# Patient Record
Sex: Female | Born: 1955 | Race: White | Hispanic: No | Marital: Married | Smoking: Former smoker
Health system: Southern US, Community
[De-identification: ages and names within clinical notes are randomized; demographics above are authoritative.]

## PROBLEM LIST (undated history)

## (undated) DIAGNOSIS — T4145XA Adverse effect of unspecified anesthetic, initial encounter: Secondary | ICD-10-CM

## (undated) DIAGNOSIS — E119 Type 2 diabetes mellitus without complications: Secondary | ICD-10-CM

## (undated) DIAGNOSIS — I10 Essential (primary) hypertension: Secondary | ICD-10-CM

## (undated) DIAGNOSIS — D649 Anemia, unspecified: Secondary | ICD-10-CM

## (undated) DIAGNOSIS — F319 Bipolar disorder, unspecified: Secondary | ICD-10-CM

## (undated) DIAGNOSIS — R160 Hepatomegaly, not elsewhere classified: Secondary | ICD-10-CM

## (undated) DIAGNOSIS — M545 Low back pain, unspecified: Secondary | ICD-10-CM

## (undated) DIAGNOSIS — K7460 Unspecified cirrhosis of liver: Secondary | ICD-10-CM

## (undated) DIAGNOSIS — E669 Obesity, unspecified: Secondary | ICD-10-CM

## (undated) DIAGNOSIS — G8929 Other chronic pain: Secondary | ICD-10-CM

## (undated) DIAGNOSIS — E8881 Metabolic syndrome: Secondary | ICD-10-CM

## (undated) DIAGNOSIS — R112 Nausea with vomiting, unspecified: Secondary | ICD-10-CM

## (undated) DIAGNOSIS — T8859XA Other complications of anesthesia, initial encounter: Secondary | ICD-10-CM

## (undated) DIAGNOSIS — R161 Splenomegaly, not elsewhere classified: Secondary | ICD-10-CM

## (undated) DIAGNOSIS — E1142 Type 2 diabetes mellitus with diabetic polyneuropathy: Secondary | ICD-10-CM

## (undated) DIAGNOSIS — K219 Gastro-esophageal reflux disease without esophagitis: Secondary | ICD-10-CM

## (undated) DIAGNOSIS — E785 Hyperlipidemia, unspecified: Secondary | ICD-10-CM

## (undated) DIAGNOSIS — Z9889 Other specified postprocedural states: Secondary | ICD-10-CM

## (undated) DIAGNOSIS — K7581 Nonalcoholic steatohepatitis (NASH): Secondary | ICD-10-CM

## (undated) DIAGNOSIS — G2581 Restless legs syndrome: Secondary | ICD-10-CM

## (undated) DIAGNOSIS — E559 Vitamin D deficiency, unspecified: Secondary | ICD-10-CM

## (undated) DIAGNOSIS — K589 Irritable bowel syndrome without diarrhea: Secondary | ICD-10-CM

## (undated) HISTORY — DX: Essential (primary) hypertension: I10

## (undated) HISTORY — PX: FINGER SURGERY: SHX640

## (undated) HISTORY — DX: Anemia, unspecified: D64.9

## (undated) HISTORY — DX: Hyperlipidemia, unspecified: E78.5

## (undated) HISTORY — DX: Low back pain: M54.5

## (undated) HISTORY — PX: GALLBLADDER SURGERY: SHX652

## (undated) HISTORY — DX: Other chronic pain: G89.29

## (undated) HISTORY — PX: OTHER SURGICAL HISTORY: SHX169

## (undated) HISTORY — DX: Metabolic syndrome: E88.810

## (undated) HISTORY — DX: Low back pain, unspecified: M54.50

## (undated) HISTORY — DX: Bipolar disorder, unspecified: F31.9

## (undated) HISTORY — DX: Hepatomegaly, not elsewhere classified: R16.0

## (undated) HISTORY — DX: Splenomegaly, not elsewhere classified: R16.1

## (undated) HISTORY — DX: Type 2 diabetes mellitus with diabetic polyneuropathy: E11.42

## (undated) HISTORY — DX: Gastro-esophageal reflux disease without esophagitis: K21.9

## (undated) HISTORY — PX: ABDOMINAL HYSTERECTOMY: SHX81

## (undated) HISTORY — DX: Type 2 diabetes mellitus without complications: E11.9

## (undated) HISTORY — PX: LUMBAR DISC SURGERY: SHX700

## (undated) HISTORY — DX: Metabolic syndrome: E88.81

## (undated) HISTORY — PX: OVARIAN CYST SURGERY: SHX726

## (undated) HISTORY — DX: Obesity, unspecified: E66.9

## (undated) HISTORY — DX: Restless legs syndrome: G25.81

## (undated) HISTORY — PX: LAMINECTOMY LUMBAR SPLINE W/ PLACEMENT SPINAL CORD STIMULATOR: SHX1916

## (undated) HISTORY — PX: CERVICAL SPINE SURGERY: SHX589

## (undated) HISTORY — DX: Vitamin D deficiency, unspecified: E55.9

---

## 2001-01-07 ENCOUNTER — Encounter: Admission: RE | Admit: 2001-01-07 | Discharge: 2001-01-07 | Payer: Self-pay | Admitting: Internal Medicine

## 2001-01-07 ENCOUNTER — Encounter: Payer: Self-pay | Admitting: Internal Medicine

## 2001-12-15 ENCOUNTER — Other Ambulatory Visit: Admission: RE | Admit: 2001-12-15 | Discharge: 2001-12-15 | Payer: Self-pay | Admitting: Internal Medicine

## 2003-07-10 ENCOUNTER — Emergency Department (HOSPITAL_COMMUNITY): Admission: EM | Admit: 2003-07-10 | Discharge: 2003-07-10 | Payer: Self-pay | Admitting: Emergency Medicine

## 2003-10-31 ENCOUNTER — Ambulatory Visit (HOSPITAL_COMMUNITY): Admission: RE | Admit: 2003-10-31 | Discharge: 2003-10-31 | Payer: Self-pay | Admitting: Internal Medicine

## 2004-02-14 ENCOUNTER — Emergency Department (HOSPITAL_COMMUNITY): Admission: EM | Admit: 2004-02-14 | Discharge: 2004-02-14 | Payer: Self-pay | Admitting: Emergency Medicine

## 2004-02-24 ENCOUNTER — Encounter: Admission: RE | Admit: 2004-02-24 | Discharge: 2004-02-24 | Payer: Self-pay | Admitting: Internal Medicine

## 2004-04-24 ENCOUNTER — Ambulatory Visit (HOSPITAL_COMMUNITY): Admission: RE | Admit: 2004-04-24 | Discharge: 2004-04-25 | Payer: Self-pay | Admitting: Neurosurgery

## 2004-05-27 ENCOUNTER — Encounter: Admission: RE | Admit: 2004-05-27 | Discharge: 2004-05-27 | Payer: Self-pay | Admitting: Neurosurgery

## 2004-09-02 ENCOUNTER — Encounter: Admission: RE | Admit: 2004-09-02 | Discharge: 2004-09-02 | Payer: Self-pay | Admitting: Neurosurgery

## 2004-09-15 ENCOUNTER — Encounter: Admission: RE | Admit: 2004-09-15 | Discharge: 2004-09-15 | Payer: Self-pay | Admitting: Neurosurgery

## 2004-09-25 ENCOUNTER — Emergency Department (HOSPITAL_COMMUNITY): Admission: EM | Admit: 2004-09-25 | Discharge: 2004-09-25 | Payer: Self-pay | Admitting: Emergency Medicine

## 2004-11-13 ENCOUNTER — Encounter: Admission: RE | Admit: 2004-11-13 | Discharge: 2004-11-13 | Payer: Self-pay | Admitting: Neurosurgery

## 2005-03-03 ENCOUNTER — Encounter: Admission: RE | Admit: 2005-03-03 | Discharge: 2005-03-03 | Payer: Self-pay | Admitting: Neurosurgery

## 2005-03-10 ENCOUNTER — Encounter: Admission: RE | Admit: 2005-03-10 | Discharge: 2005-03-10 | Payer: Self-pay | Admitting: Neurosurgery

## 2005-06-12 ENCOUNTER — Encounter: Admission: RE | Admit: 2005-06-12 | Discharge: 2005-06-12 | Payer: Self-pay | Admitting: Neurosurgery

## 2005-09-12 ENCOUNTER — Emergency Department (HOSPITAL_COMMUNITY): Admission: EM | Admit: 2005-09-12 | Discharge: 2005-09-12 | Payer: Self-pay | Admitting: Emergency Medicine

## 2005-09-25 IMAGING — RF DG MYELOGRAM LUMBAR
13 of 15 series · 13 of 15 positions shown · IV contrast (omnipaque)
Comparison: none

CLINICAL DATA: The patient has had multiple low back surgeries with fusion from L4 through S1.  She has continued left low back and left lower extremity pain. 
LUMBAR MYELOGRAM:
The low back was prepped and draped in a sterile fashion.  Lidocaine was utilized for local anesthesia.  Under fluoroscopic guidance, a 22 gauge spinal needle was inserted into the CSF space via a left paramedian L3-4 approach.  20 cc Omnipaque 180 was administered without complications.

[Series 1: (hospital) · 1 of 1 slices shown]
[im 1/1]
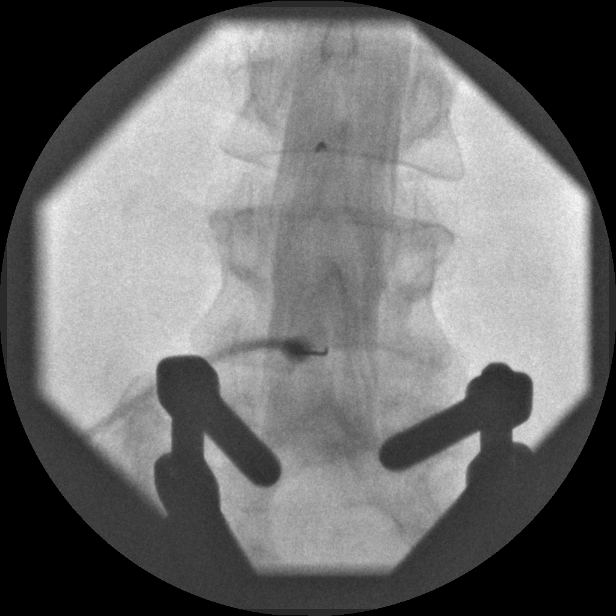

[Series 2: myelogram  white · 1 of 1 slices shown (1 of 12)]
[im 1/1]
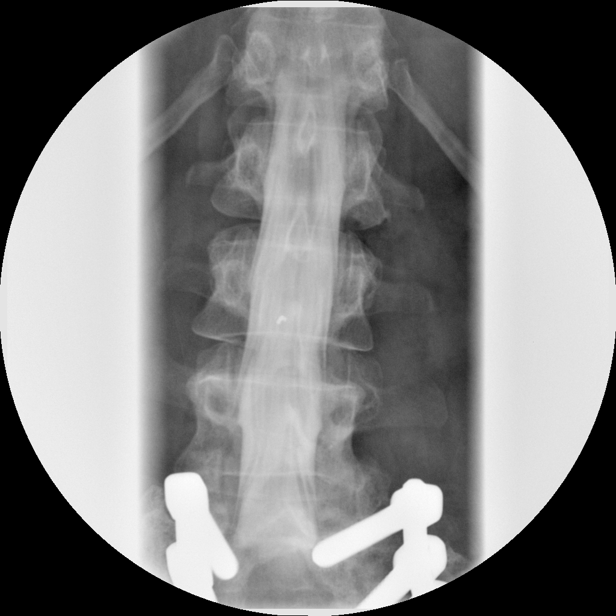

[Series 3: myelogram  white · 1 of 1 slices shown (2 of 12)]
[im 1/1]
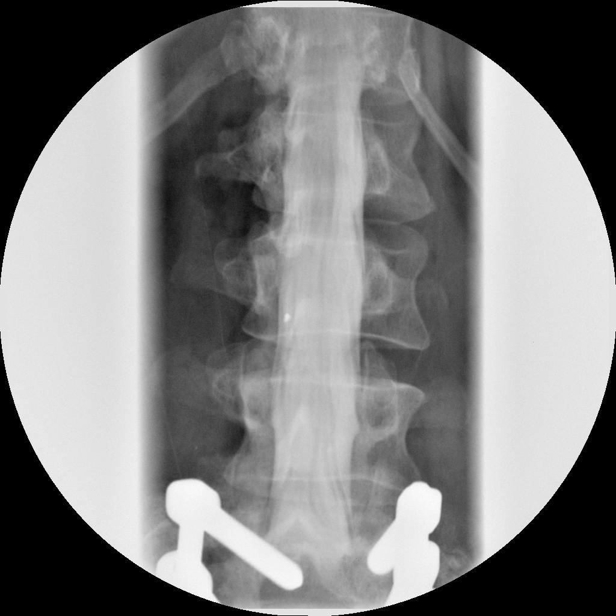

[Series 5: myelogram  white · 1 of 1 slices shown (3 of 12)]
[im 1/1]
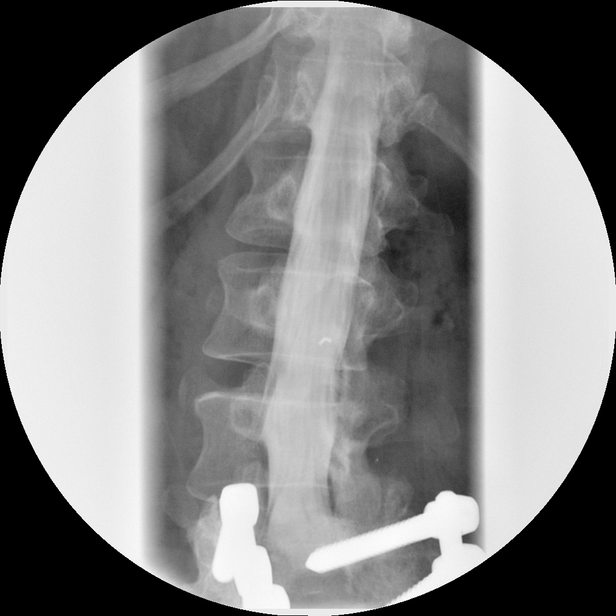

[Series 6: myelogram  white · 1 of 1 slices shown (4 of 12)]
[im 1/1]
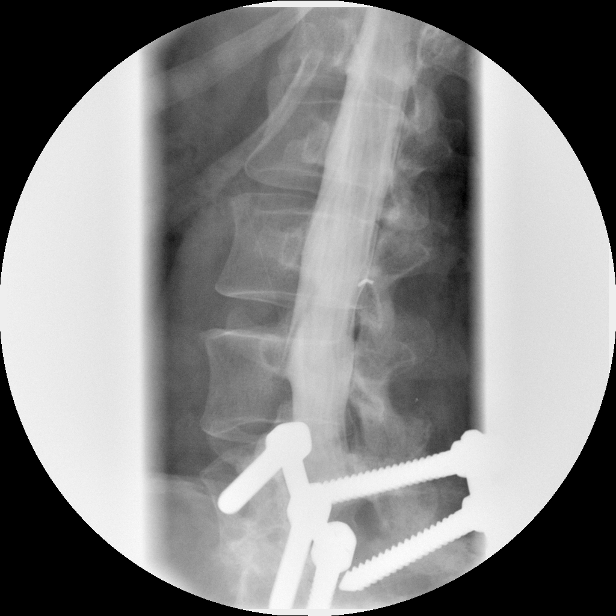

[Series 7: myelogram  white · 1 of 1 slices shown (5 of 12)]
[im 1/1]
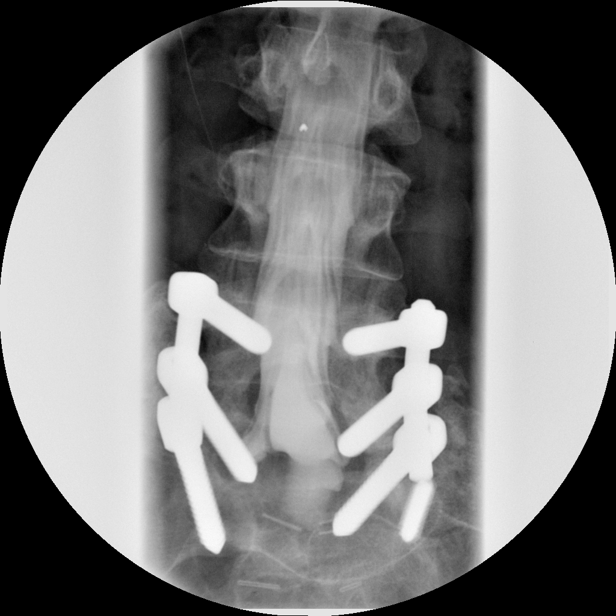

[Series 8: myelogram  white · 1 of 1 slices shown (6 of 12)]
[im 1/1]
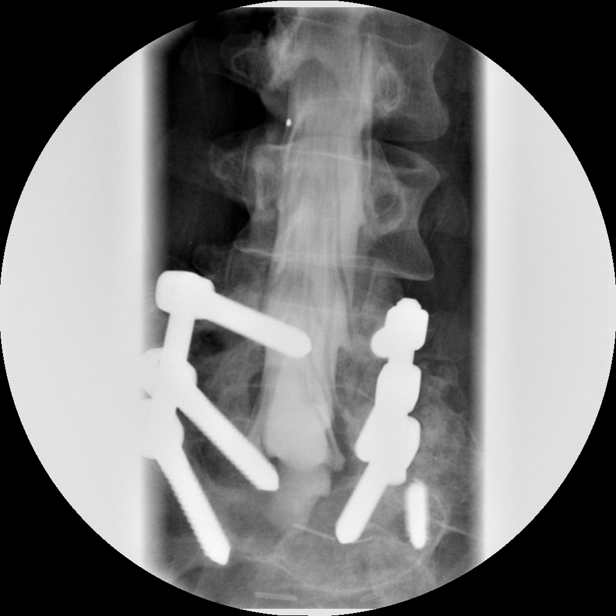

[Series 9: myelogram  white · 1 of 1 slices shown (7 of 12)]
[im 1/1]
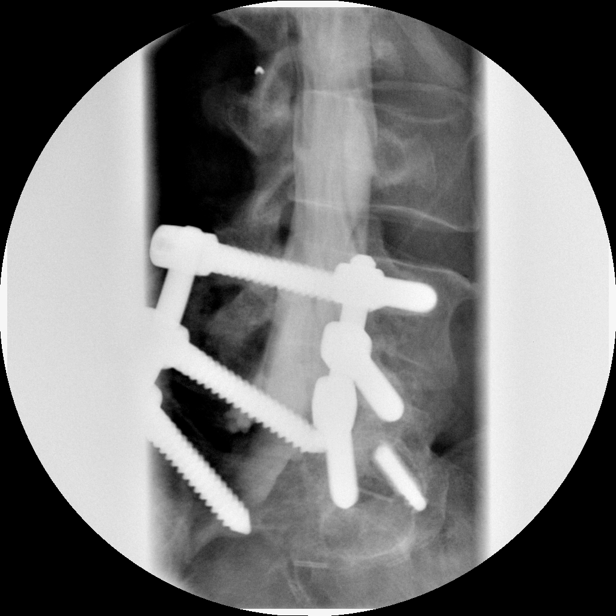

[Series 10: myelogram  white · 1 of 1 slices shown (8 of 12)]
[im 1/1]
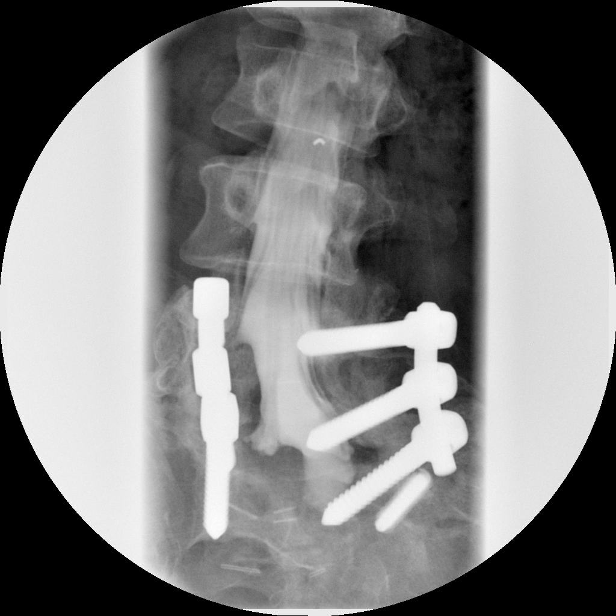

[Series 11: myelogram  white · 1 of 1 slices shown (9 of 12)]
[im 1/1]
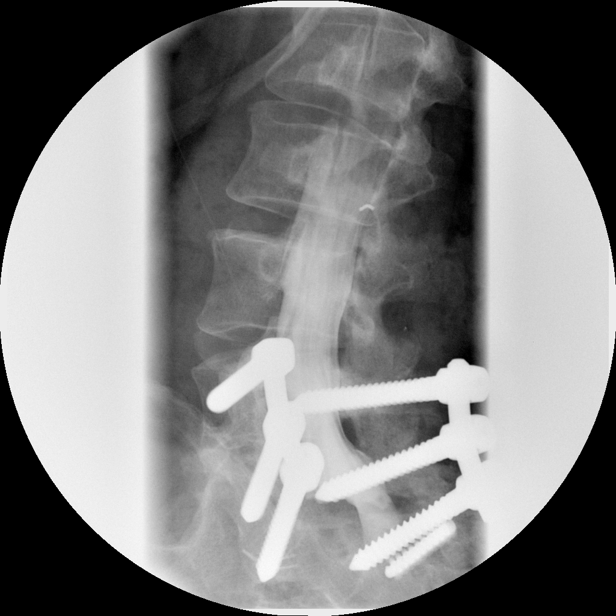

[Series 13: myelogram  white · 1 of 1 slices shown (10 of 12)]
[im 1/1]
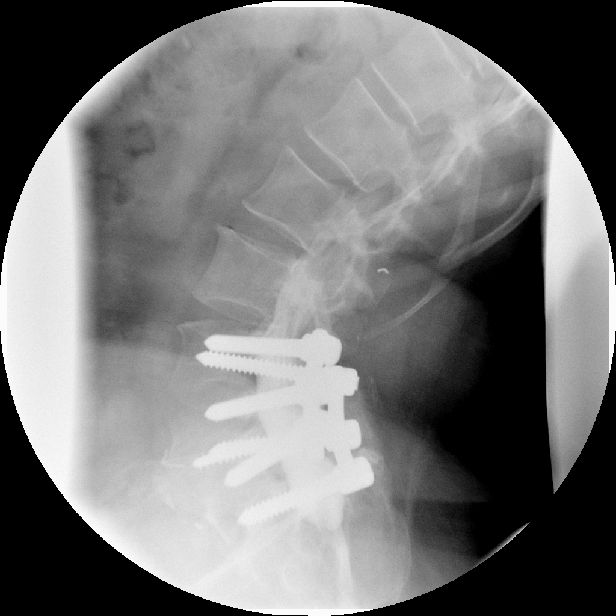

[Series 14: myelogram  white · 1 of 1 slices shown (11 of 12)]
[im 1/1]
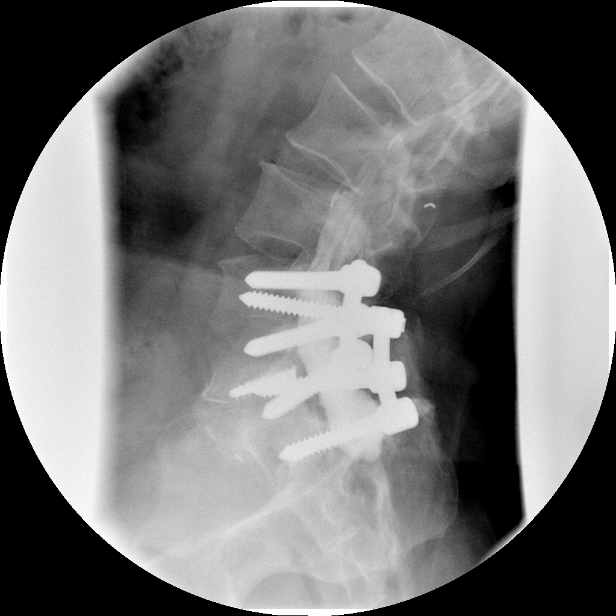

[Series 15: myelogram  white · 1 of 1 slices shown (12 of 12)]
[im 1/1]
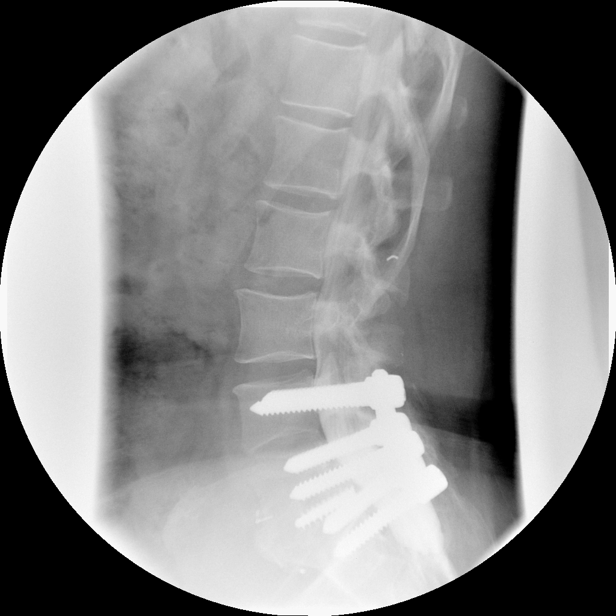

[13 of 15 positions shown; findings below may reference images not displayed]

FINDINGS: The images document intrathecal contrast.  Bilateral pedicle screws are seen in L4, L5 and S1.  An additional screw fragment is seen in the right S1 pedicle from previously broken hardware.  There is also a metallic fragment very small that is in the L1-2 interspinous ligament. 
There is a significant left paracentral extradural defect causing mass effect upon the thecal sac at the L5-S1 disk just left of center.    There is no evidence of abnormal motion on the flexion or extension views to suggest instability.  There is gross alignment of the vertebral bodies on the lateral view.  There is no vertebral body height loss.  Callus formation is seen at the L5-S1 disk.
IMPRESSION: Lumbar myelogram in preparation for CT.  Mass effect upon the left S1 nerve root is noted. 

LUMBAR MYELOGRAM CT:
FINDINGS: The images were reconstructed in multiplanar views.  2 to 3 mm of anterolisthesis of L5 upon S1 is seen.  There is no vertebral body height loss.  Bilateral pedicle screws are seen at L4, L5, and S1.  A second screw fragment is seen in the right S1 pedicle.  A very tiny metallic fragment is seen in the L1-2 interspinous ligament.  A punctate metallic density is seen in the right paraspinal soft tissues at L3.  There is significant streak artifact from the metallic hardware.  There is, however, solid fusion that is visualized across the posterior elements from L4 through S1.  Solid fusion through portions of the L5-S1 disk is present.  The L4-5 disk is not fused.  The right S1 pedicle screw that is intact does encroach upon the right S1 nerve root foramen.  The patient?s symptoms are left sided, however. 
L1-2, L2-3:  No disk herniation, central stenosis or foraminal stenosis.
L3-4:  A focal left foraminal disk protrusion is present without neural impingement.  Posterior decompression is noted.  Facet arthropathy is mild, right greater than left. 
L4-5:  Posterior decompression is noted without central canal stenosis.  The foramina are grossly patent.  The left L5 nerve root sleeve is difficult to visualize. 
L5-S1:  Posterior decompression and bilateral pedicle screws are noted.  The intact right pedicle screw does encroach upon the right S1 neural foramen.  This effaces the right S1 nerve root sleeve.  The left S1 nerve root sleeve is dilated and is effaced at the lateral recess.
IMPRESSION: 1.  Posterior fusion L4 through S1 is solid.  
2.  The left L5 nerve root sleeve is difficult to visualize due to streak artifact. 
3.  The right S1 pedicle screw encroaches upon the S1 foramen with effacement of the nerve root sleeve, however, the patient?s symptoms are left sided. 
4.  Left foraminal disk herniation at L3-4. 
5.   The left S1 nerve root sleeve is dilated and is then effaced in the lateral recess. I suspect this is due to scar/fibrosis.

## 2006-07-13 ENCOUNTER — Other Ambulatory Visit: Admission: RE | Admit: 2006-07-13 | Discharge: 2006-07-13 | Payer: Self-pay | Admitting: Internal Medicine

## 2008-04-13 ENCOUNTER — Encounter: Admission: RE | Admit: 2008-04-13 | Discharge: 2008-04-13 | Payer: Self-pay | Admitting: Orthopedic Surgery

## 2008-07-23 ENCOUNTER — Encounter: Admission: RE | Admit: 2008-07-23 | Discharge: 2008-07-23 | Payer: Self-pay | Admitting: Internal Medicine

## 2009-04-24 ENCOUNTER — Encounter: Admission: RE | Admit: 2009-04-24 | Discharge: 2009-04-24 | Payer: Self-pay | Admitting: Internal Medicine

## 2009-07-08 ENCOUNTER — Other Ambulatory Visit: Admission: RE | Admit: 2009-07-08 | Discharge: 2009-07-08 | Payer: Self-pay | Admitting: Internal Medicine

## 2009-09-05 ENCOUNTER — Emergency Department (HOSPITAL_COMMUNITY): Admission: EM | Admit: 2009-09-05 | Discharge: 2009-09-05 | Payer: Self-pay | Admitting: Emergency Medicine

## 2010-02-06 ENCOUNTER — Encounter: Admission: RE | Admit: 2010-02-06 | Discharge: 2010-02-06 | Payer: Self-pay | Admitting: Internal Medicine

## 2010-05-02 ENCOUNTER — Encounter: Admission: RE | Admit: 2010-05-02 | Discharge: 2010-05-02 | Payer: Self-pay | Admitting: Internal Medicine

## 2010-06-27 ENCOUNTER — Encounter: Admission: RE | Admit: 2010-06-27 | Discharge: 2010-06-27 | Payer: Self-pay | Admitting: Obstetrics and Gynecology

## 2010-08-17 ENCOUNTER — Encounter: Payer: Self-pay | Admitting: Internal Medicine

## 2010-08-17 ENCOUNTER — Encounter: Payer: Self-pay | Admitting: Neurosurgery

## 2010-08-18 ENCOUNTER — Encounter: Payer: Self-pay | Admitting: Orthopedic Surgery

## 2010-08-26 ENCOUNTER — Other Ambulatory Visit: Payer: Self-pay | Admitting: Orthopedic Surgery

## 2010-08-26 DIAGNOSIS — IMO0002 Reserved for concepts with insufficient information to code with codable children: Secondary | ICD-10-CM

## 2010-08-26 DIAGNOSIS — M47817 Spondylosis without myelopathy or radiculopathy, lumbosacral region: Secondary | ICD-10-CM

## 2010-08-28 ENCOUNTER — Ambulatory Visit
Admission: RE | Admit: 2010-08-28 | Discharge: 2010-08-28 | Disposition: A | Discharge status: Home / Self Care | Payer: MEDICARE | Source: Ambulatory Visit | Attending: Orthopedic Surgery | Admitting: Orthopedic Surgery

## 2010-08-28 DIAGNOSIS — IMO0002 Reserved for concepts with insufficient information to code with codable children: Secondary | ICD-10-CM

## 2010-08-28 DIAGNOSIS — M47817 Spondylosis without myelopathy or radiculopathy, lumbosacral region: Secondary | ICD-10-CM

## 2010-11-24 ENCOUNTER — Other Ambulatory Visit: Payer: Self-pay | Admitting: Obstetrics and Gynecology

## 2010-11-24 DIAGNOSIS — Z09 Encounter for follow-up examination after completed treatment for conditions other than malignant neoplasm: Secondary | ICD-10-CM

## 2010-12-12 NOTE — Op Note (Signed)
NAME:  LANAYA, BENNIS                 ACCOUNT NO.:  [id number]   MEDICAL RECORD NO.:  [id number]          PATIENT TYPE:  OIB   LOCATION:  5008                         FACILITY:  [facility]   PHYSICIAN:  Donalee Citrin, M.D.        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   POSTOPERATIVE DIAGNOSIS:  Cervical spondylitic myelopathy from ruptured  disk, cervical spinal stenosis C3-4 as well as C5 radiculopathy from  ruptured disk C4-5.   PROCEDURE:  Anterior cervical diskectomy with fusion C3-4 and C4-5 using  superomedial patellar wedges and the 37 __________ vision plate with four 12  mm __________ screws and two 13 mm screws.  These were then plated with four  12 mm __________ screws and two 13 mm screws.   SURGEON:  Donalee Citrin, M.D.   ASSISTANT:  __________   ANESTHESIA:  General endotracheal.   HISTORY OF PRESENT ILLNESS:  The patient is a very pleasant 55 year old  female who has had progressive neck pain as well as bilateral arm pain and  weakness.  Initially it began on the right side and has progressed to the  left with weakness in her hands and difficulty opening jars.  X-rays showed  a very large ruptured disk causing severe spinal compression, spinal  stenosis at C3-4 as well as a ruptured disk preforaminally, compressing the  right C5 nerve root.  Both of these were felt to be symptomatic, and due to  the patient's clinical exam and preoperative imaging, the patient was  recommended to undergo cervical diskectomy.  I did explain to him the risks  and benefits of surgery.  She understands and agrees to go forward.   DESCRIPTION OF PROCEDURE:  The patient was taken to the operating room.  She  was induced under general anesthesia.   Supine, neck in slight extension and  five pounds of Halter traction, the right side of her neck was prepped and  draped in the usual sterile fashion.  Preoperative imaging localized the C5  vertebral body.  A curvilinear incision was made just off the midline and to  the anterior border of the sternocleidomastoid, superficial layer of the  platysma and dissected out longitudinally.  The avascular plane between the  sternocleidomastoid and strap muscle was developed down to the prevertebral  fascia.  The prevertebral fascia was dissected using Kitners.  Intraoperative x-ray confirmed localization of the C4-5 disk space.  The  longus coli was then reflected laterally.  Annulotomy was extended with the  15 blade scalpel to mark the disk spaces.  A self-retaining retractor was  placed .  The both annulotomies were extended.  A high speed drill was used  to move in toward the annulus and using 2-3 mm Kerrison punch, anterior  osteophytic punch of C3 and C4 vertebral bodies were underbitten.  Then  first  at C3-4, a high speed drill was used to drill down to the posterior  annulus and posterior longitudinal ligament.  This was all removed in a  piecemeal fashion.  There was noted to be significant hypertrophy in the  ligaments and disk material as well as a large posterior aspect on the C3  vertebral body, compressing the thecal sac both centrally and left.  This  was all underbitten with a 1-2 mm Kerrison punch and both proximal C4 nerve  neural foramina were identified and decompressed.  At the end a diskectomy  revealed no further stenosis at this level.  Gelfoam was placed.  The  __________  but the procedure was repeated.  Posterior annulus and posterior  osteophytes were removed in a piecemeal fashion, decompressing the thecal  sac.  There was noted to be some uncinate hypertrophy and disk material  compressing the right C5.  This was all under bitten and removed.  Both C5  neural foramina  were explored and noted to be widely decompressed.  At the  end of the diskectomy there was no further stenosis.  Two 6 mm __________  were sized and fashioned to fit approximately 1 mm deep to the anterior  vertebrae with good apposition of the endplate.  A 37.5 mm endplate was  sized, selected and inserted.  Six screws were inserted.  All screws had  excellent purchase.  Two 13 mm screws in C3 and C4, 12 mm in C4 and C5.  __________  was copiously irrigated with meticulous hemostasis maintained.  The platysma was reapproximated with interrupted 3-0 interrupted Vicryl, and  the skin was closed with a running 4-0 subcuticular.  Benzoin and Steri-  Strips were applied.  The patient went to the recovery room in stable  condition.       GC/MEDQ  D:  04/24/2004  T:  04/25/2004  Job:  [id number]

## 2011-02-18 ENCOUNTER — Ambulatory Visit
Admission: RE | Admit: 2011-02-18 | Discharge: 2011-02-18 | Disposition: A | Discharge status: Home / Self Care | Payer: Medicare Other | Source: Ambulatory Visit | Attending: Internal Medicine | Admitting: Internal Medicine

## 2011-02-18 ENCOUNTER — Other Ambulatory Visit: Payer: Self-pay | Admitting: Internal Medicine

## 2011-02-18 ENCOUNTER — Ambulatory Visit
Admission: RE | Admit: 2011-02-18 | Discharge: 2011-02-18 | Disposition: A | Discharge status: Home / Self Care | Payer: Medicare Other | Source: Ambulatory Visit | Attending: Obstetrics and Gynecology | Admitting: Obstetrics and Gynecology

## 2011-02-18 DIAGNOSIS — Z09 Encounter for follow-up examination after completed treatment for conditions other than malignant neoplasm: Secondary | ICD-10-CM

## 2011-02-24 ENCOUNTER — Other Ambulatory Visit: Payer: Self-pay | Admitting: Internal Medicine

## 2011-02-24 ENCOUNTER — Ambulatory Visit
Admission: RE | Admit: 2011-02-24 | Discharge: 2011-02-24 | Disposition: A | Discharge status: Home / Self Care | Payer: Medicare Other | Source: Ambulatory Visit | Attending: Internal Medicine | Admitting: Internal Medicine

## 2011-02-24 DIAGNOSIS — Z09 Encounter for follow-up examination after completed treatment for conditions other than malignant neoplasm: Secondary | ICD-10-CM

## 2012-03-08 ENCOUNTER — Other Ambulatory Visit: Payer: Self-pay | Admitting: Internal Medicine

## 2012-03-08 DIAGNOSIS — Z1231 Encounter for screening mammogram for malignant neoplasm of breast: Secondary | ICD-10-CM

## 2012-03-29 ENCOUNTER — Ambulatory Visit
Admission: RE | Admit: 2012-03-29 | Discharge: 2012-03-29 | Disposition: A | Discharge status: Home / Self Care | Payer: Medicare Other | Source: Ambulatory Visit | Attending: Internal Medicine | Admitting: Internal Medicine

## 2012-03-29 DIAGNOSIS — Z1231 Encounter for screening mammogram for malignant neoplasm of breast: Secondary | ICD-10-CM

## 2012-08-03 DIAGNOSIS — G2581 Restless legs syndrome: Secondary | ICD-10-CM | POA: Insufficient documentation

## 2012-08-03 DIAGNOSIS — E1142 Type 2 diabetes mellitus with diabetic polyneuropathy: Secondary | ICD-10-CM | POA: Insufficient documentation

## 2012-11-24 ENCOUNTER — Encounter: Payer: Self-pay | Admitting: Neurology

## 2012-11-24 ENCOUNTER — Ambulatory Visit: Payer: Self-pay | Admitting: Neurology

## 2012-11-24 DIAGNOSIS — E1142 Type 2 diabetes mellitus with diabetic polyneuropathy: Secondary | ICD-10-CM

## 2012-11-24 DIAGNOSIS — G2581 Restless legs syndrome: Secondary | ICD-10-CM

## 2012-11-25 ENCOUNTER — Telehealth: Payer: Self-pay | Admitting: *Deleted

## 2012-11-25 MED ORDER — PRAMIPEXOLE DIHYDROCHLORIDE 0.25 MG PO TABS: 0.2500 mg | ORAL_TABLET | Freq: Three times a day (TID) | ORAL | Status: DC

## 2012-11-25 NOTE — Telephone Encounter (Signed)
Patient called stating she has had two days of really bad tremors and wants to know if her Mirapex could be increased.

## 2012-11-25 NOTE — Telephone Encounter (Signed)
I called patient. She is having increasing problems with her restless leg syndrome. We will increase the Mirapex taking 0.25 mg 3 times daily.

## 2012-12-12 ENCOUNTER — Other Ambulatory Visit: Payer: Self-pay | Admitting: Neurology

## 2013-03-15 ENCOUNTER — Telehealth: Payer: Self-pay | Admitting: Neurology

## 2013-03-15 MED ORDER — ROTIGOTINE 3 MG/24HR TD PT24: 3.0000 mg/d | MEDICATED_PATCH | Freq: Every day | TRANSDERMAL | Status: DC

## 2013-03-15 NOTE — Telephone Encounter (Signed)
I called patient. She is having increased issues with the restless leg syndrome. The patient is having chronic nausea, and the Zofran is no longer helping. I am not clear whether the Mirapex is the cause of the nausea, but we will try switching the patient off of Mirapex to Neupro patch.

## 2013-03-31 ENCOUNTER — Other Ambulatory Visit: Payer: Self-pay

## 2013-03-31 DIAGNOSIS — Z1231 Encounter for screening mammogram for malignant neoplasm of breast: Secondary | ICD-10-CM

## 2013-04-04 ENCOUNTER — Ambulatory Visit: Payer: Medicare Other

## 2013-05-18 ENCOUNTER — Ambulatory Visit
Admission: RE | Admit: 2013-05-18 | Discharge: 2013-05-18 | Disposition: A | Discharge status: Home / Self Care | Payer: Medicare Other | Source: Ambulatory Visit

## 2013-05-18 DIAGNOSIS — Z1231 Encounter for screening mammogram for malignant neoplasm of breast: Secondary | ICD-10-CM

## 2013-05-23 ENCOUNTER — Other Ambulatory Visit: Payer: Self-pay | Admitting: Internal Medicine

## 2013-05-23 DIAGNOSIS — R928 Other abnormal and inconclusive findings on diagnostic imaging of breast: Secondary | ICD-10-CM

## 2013-06-12 ENCOUNTER — Ambulatory Visit
Admission: RE | Admit: 2013-06-12 | Discharge: 2013-06-12 | Disposition: A | Discharge status: Home / Self Care | Payer: Medicare Other | Source: Ambulatory Visit | Attending: Internal Medicine | Admitting: Internal Medicine

## 2013-06-12 DIAGNOSIS — R928 Other abnormal and inconclusive findings on diagnostic imaging of breast: Secondary | ICD-10-CM

## 2013-08-23 ENCOUNTER — Ambulatory Visit
Admission: RE | Admit: 2013-08-23 | Discharge: 2013-08-23 | Disposition: A | Discharge status: Home / Self Care | Payer: Medicare Other | Source: Ambulatory Visit | Attending: Internal Medicine | Admitting: Internal Medicine

## 2013-08-23 ENCOUNTER — Other Ambulatory Visit: Payer: Self-pay | Admitting: Internal Medicine

## 2013-08-23 DIAGNOSIS — M25569 Pain in unspecified knee: Secondary | ICD-10-CM

## 2013-08-27 ENCOUNTER — Other Ambulatory Visit: Payer: Self-pay | Admitting: Neurology

## 2013-10-02 ENCOUNTER — Telehealth: Payer: Self-pay | Admitting: Neurology

## 2013-10-02 ENCOUNTER — Ambulatory Visit: Payer: Self-pay | Admitting: Neurology

## 2013-10-02 NOTE — Telephone Encounter (Signed)
This patient did not show for a revisit appointment today. 

## 2013-10-19 ENCOUNTER — Other Ambulatory Visit: Payer: Self-pay | Admitting: Neurology

## 2013-10-19 NOTE — Telephone Encounter (Signed)
Patient has no showed last 2 appts  

## 2013-10-25 ENCOUNTER — Other Ambulatory Visit: Payer: Self-pay | Admitting: Neurology

## 2013-10-26 NOTE — Telephone Encounter (Signed)
Patient has not been seen in over 1 year and she has no showed last 2 appts in Epic.

## 2013-11-01 ENCOUNTER — Other Ambulatory Visit: Payer: Self-pay | Admitting: Orthopedic Surgery

## 2013-11-01 DIAGNOSIS — M545 Low back pain, unspecified: Secondary | ICD-10-CM

## 2013-11-07 ENCOUNTER — Other Ambulatory Visit: Payer: Medicare Other

## 2013-11-10 ENCOUNTER — Other Ambulatory Visit: Payer: Self-pay | Admitting: Internal Medicine

## 2013-11-10 DIAGNOSIS — N6009 Solitary cyst of unspecified breast: Secondary | ICD-10-CM

## 2013-11-14 ENCOUNTER — Other Ambulatory Visit: Payer: Medicare Other

## 2013-11-21 ENCOUNTER — Other Ambulatory Visit: Payer: Self-pay | Admitting: Neurology

## 2013-11-22 ENCOUNTER — Other Ambulatory Visit: Payer: Medicare Other

## 2013-11-28 ENCOUNTER — Ambulatory Visit
Admission: RE | Admit: 2013-11-28 | Discharge: 2013-11-28 | Disposition: A | Discharge status: Home / Self Care | Payer: Medicare Other | Source: Ambulatory Visit | Attending: Internal Medicine | Admitting: Internal Medicine

## 2013-11-28 ENCOUNTER — Encounter (INDEPENDENT_AMBULATORY_CARE_PROVIDER_SITE_OTHER): Payer: Self-pay

## 2013-11-28 DIAGNOSIS — N6009 Solitary cyst of unspecified breast: Secondary | ICD-10-CM

## 2013-12-01 ENCOUNTER — Ambulatory Visit
Admission: RE | Admit: 2013-12-01 | Discharge: 2013-12-01 | Disposition: A | Discharge status: Home / Self Care | Payer: Medicare Other | Source: Ambulatory Visit | Attending: Orthopedic Surgery | Admitting: Orthopedic Surgery

## 2013-12-01 VITALS — BP 133/72 | HR 97

## 2013-12-01 DIAGNOSIS — M545 Low back pain, unspecified: Secondary | ICD-10-CM

## 2013-12-01 MED ORDER — ONDANSETRON HCL 4 MG/2ML IJ SOLN
4.0000 mg | Freq: Once | INTRAMUSCULAR | Status: AC
  Administered 2013-12-01: 4 mg via INTRAMUSCULAR

## 2013-12-01 MED ORDER — DIAZEPAM 5 MG PO TABS
10.0000 mg | ORAL_TABLET | Freq: Once | ORAL | Status: AC
  Administered 2013-12-01: 10 mg via ORAL

## 2013-12-01 MED ORDER — MEPERIDINE HCL 100 MG/ML IJ SOLN
100.0000 mg | Freq: Once | INTRAMUSCULAR | Status: AC
  Administered 2013-12-01: 100 mg via INTRAMUSCULAR

## 2013-12-01 MED ORDER — IOHEXOL 180 MG/ML  SOLN
15.0000 mL | Freq: Once | INTRAMUSCULAR | Status: AC | PRN
  Administered 2013-12-01: 15 mL via INTRATHECAL

## 2013-12-01 NOTE — Progress Notes (Signed)
Pt states she has been off wellbutrin for the past 2 days.  Discharge instructions explained to pt and her husband.

## 2013-12-01 NOTE — Discharge Instructions (Signed)
Myelogram Discharge Instructions  1. Go home and rest quietly for the next 24 hours.  It is important to lie flat for the next 24 hours.  Get up only to go to the restroom.  You may lie in the bed or on a couch on your back, your stomach, your left side or your right side.  You may have one pillow under your head.  You may have pillows between your knees while you are on your side or under your knees while you are on your back.  2. DO NOT drive today.  Recline the seat as far back as it will go, while still wearing your seat belt, on the way home.  3. You may get up to go to the bathroom as needed.  You may sit up for 10 minutes to eat.  You may resume your normal diet and medications unless otherwise indicated.  Drink lots of extra fluids today and tomorrow.  4. The incidence of headache, nausea, or vomiting is about 5% (one in 20 patients).  If you develop a headache, lie flat and drink plenty of fluids until the headache goes away.  Caffeinated beverages may be helpful.  If you develop severe nausea and vomiting or a headache that does not go away with flat bed rest, call [phone].  5. You may resume normal activities after your 24 hours of bed rest is over; however, do not exert yourself strongly or do any heavy lifting tomorrow. If when you get up you have a headache when standing, go back to bed and force fluids for another 24 hours.  6. Call your physician for a follow-up appointment.  The results of your myelogram will be sent directly to your physician by the following day.  7. If you have any questions or if complications develop after you arrive home, please call [phone].  Discharge instructions have been explained to the patient.  The patient, or the person responsible for the patient, fully understands these instructions.      May resume Wellbutrin on Dec 02, 2013, after 9:30 am.

## 2013-12-01 NOTE — Progress Notes (Signed)
Patient resting quietly on stretcher in nursing station after myelogram.  Husband at bedside.  Donell SievertJeanne Lohr, RN

## 2013-12-21 ENCOUNTER — Other Ambulatory Visit: Payer: Self-pay | Admitting: Orthopaedic Surgery

## 2013-12-26 ENCOUNTER — Ambulatory Visit: Payer: Self-pay | Admitting: Neurology

## 2014-01-18 ENCOUNTER — Ambulatory Visit (HOSPITAL_COMMUNITY)
Admission: RE | Admit: 2014-01-18 | Discharge: 2014-01-18 | Disposition: A | Discharge status: Home / Self Care | Payer: Medicare Other | Source: Ambulatory Visit | Attending: Orthopaedic Surgery | Admitting: Orthopaedic Surgery

## 2014-01-18 ENCOUNTER — Encounter (HOSPITAL_COMMUNITY): Payer: Self-pay

## 2014-01-18 ENCOUNTER — Encounter (HOSPITAL_COMMUNITY)
Admission: RE | Admit: 2014-01-18 | Discharge: 2014-01-18 | Disposition: A | Discharge status: Home / Self Care | Payer: Medicare Other | Source: Ambulatory Visit | Attending: Orthopaedic Surgery | Admitting: Orthopaedic Surgery

## 2014-01-18 DIAGNOSIS — Z01818 Encounter for other preprocedural examination: Secondary | ICD-10-CM | POA: Insufficient documentation

## 2014-01-18 HISTORY — DX: Irritable bowel syndrome, unspecified: K58.9

## 2014-01-18 HISTORY — DX: Other complications of anesthesia, initial encounter: T88.59XA

## 2014-01-18 HISTORY — DX: Adverse effect of unspecified anesthetic, initial encounter: T41.45XA

## 2014-01-18 HISTORY — DX: Other specified postprocedural states: R11.2

## 2014-01-18 HISTORY — DX: Other specified postprocedural states: Z98.890

## 2014-01-18 HISTORY — DX: Nonalcoholic steatohepatitis (NASH): K75.81

## 2014-01-18 HISTORY — DX: Unspecified cirrhosis of liver: K74.60

## 2014-01-18 LAB — TYPE AND SCREEN
ABO/RH(D): B NEG
Antibody Screen: NEGATIVE

## 2014-01-18 LAB — CBC WITH DIFFERENTIAL/PLATELET
BASOS ABS: 0 10*3/uL (ref 0.0–0.1)
Basophils Relative: 0 % (ref 0–1)
EOS ABS: 0.1 10*3/uL (ref 0.0–0.7)
EOS PCT: 2 % (ref 0–5)
HCT: 37.3 % (ref 36.0–46.0)
Hemoglobin: 11.4 g/dL — ABNORMAL LOW (ref 12.0–15.0)
Lymphocytes Relative: 31 % (ref 12–46)
Lymphs Abs: 2.3 10*3/uL (ref 0.7–4.0)
MCH: 26.3 pg (ref 26.0–34.0)
MCHC: 30.6 g/dL (ref 30.0–36.0)
MCV: 85.9 fL (ref 78.0–100.0)
Monocytes Absolute: 0.8 10*3/uL (ref 0.1–1.0)
Monocytes Relative: 10 % (ref 3–12)
NEUTROS PCT: 57 % (ref 43–77)
Neutro Abs: 4.3 10*3/uL (ref 1.7–7.7)
PLATELETS: 137 10*3/uL — AB (ref 150–400)
RBC: 4.34 MIL/uL (ref 3.87–5.11)
RDW: 15.8 % — AB (ref 11.5–15.5)
WBC: 7.6 10*3/uL (ref 4.0–10.5)

## 2014-01-18 LAB — PROTIME-INR
INR: 1.16 (ref 0.00–1.49)
PROTHROMBIN TIME: 14.8 s (ref 11.6–15.2)

## 2014-01-18 LAB — BASIC METABOLIC PANEL
BUN: 7 mg/dL (ref 6–23)
CO2: 28 meq/L (ref 19–32)
CREATININE: 0.88 mg/dL (ref 0.50–1.10)
Calcium: 9.8 mg/dL (ref 8.4–10.5)
Chloride: 96 mEq/L (ref 96–112)
GFR calc Af Amer: 83 mL/min — ABNORMAL LOW (ref >90)
GFR, EST NON AFRICAN AMERICAN: 72 mL/min — AB (ref >90)
GLUCOSE: 163 mg/dL — AB (ref 70–99)
Potassium: 3.4 mEq/L — ABNORMAL LOW (ref 3.7–5.3)
Sodium: 143 mEq/L (ref 137–147)

## 2014-01-18 LAB — URINALYSIS, ROUTINE W REFLEX MICROSCOPIC
BILIRUBIN URINE: NEGATIVE
Glucose, UA: NEGATIVE mg/dL
HGB URINE DIPSTICK: NEGATIVE
Ketones, ur: NEGATIVE mg/dL
Nitrite: NEGATIVE
PH: 7 (ref 5.0–8.0)
Protein, ur: NEGATIVE mg/dL
SPECIFIC GRAVITY, URINE: 1.008 (ref 1.005–1.030)
Urobilinogen, UA: 1 mg/dL (ref 0.0–1.0)

## 2014-01-18 LAB — ABO/RH: ABO/RH(D): B NEG

## 2014-01-18 LAB — URINE MICROSCOPIC-ADD ON

## 2014-01-18 LAB — SURGICAL PCR SCREEN
MRSA, PCR: NEGATIVE
Staphylococcus aureus: POSITIVE — AB

## 2014-01-18 LAB — APTT: APTT: 33 s (ref 24–37)

## 2014-01-18 NOTE — Pre-Procedure Instructions (Signed)
Victoria Kaiser  01/18/2014   Your procedure is scheduled on:  Tuesday, July 7th    [facility] at  5:30 AM.  Call this number if you have problems the morning of surgery: [phone]   Remember:   Do not eat food or drink liquids after midnight Monday.   Take these medicines the morning of surgery with A SIP OF WATER: Omeprazole, Levothyroxine, Wellbutrin   Do not wear jewelry, make-up or nail polish.  Do not wear lotions, powders, or perfumes. You may NOT wear deodorant.  Do not shave underarms & legs 48 hours prior to surgery.   Do not bring valuables to the hospital.  [facility] is not responsible  for any belongings or valuables.               Contacts, dentures or bridgework may not be worn into surgery.  Leave suitcase in the car. After surgery it may be brought to your room.  For patients admitted to the hospital, discharge time is determined by your treatment team.               Name and phone number of your driver: RUSS  Nason    SPOUSE   Special Instructions: "Preparing for Surgery" instruction sheet   Please read over the following fact sheets that you were given: Pain Booklet, Blood Transfusion Information, MRSA Information and Surgical Site Infection Prevention

## 2014-01-18 NOTE — Progress Notes (Addendum)
Had "sleep study" approx 3 months ago -- PCP is Dr. Rayann HemanJohn Griffin--am requesting results [phone] (EKG & sleep study  rec'd and inside chart)DA Was worked up for CP (had echo & ekg) @ [facility]  And [phone].  ( WAITING ON ECHO..... 1409)

## 2014-01-19 ENCOUNTER — Encounter (HOSPITAL_COMMUNITY): Payer: Self-pay

## 2014-01-19 NOTE — Progress Notes (Addendum)
Anesthesia chart review: Patient is a 58 year old female scheduled for left TKA on 01/30/14 by Dr. Jerl Santosalldorf.  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 Marcelene ButteHurrelbrink (according to PCP notes), IBS, post-operative N/V, lumbar laminectomy, spinal cord stimulator, c-spine surgery, hysterectomy, cholecystectomy. BMI is consistent with morbid obesity. PCP is Dr. Kirby FunkJohn Griffin. She had a normal apnea hypopnea index in 08/2013, so no sleep study was recommended.  EKG on 04/04/13 (Dr. Valentina LucksGriffin) showed: Sinus rhythm, voltage criteria for LVH, old anterior infarct, nonspecific ST depression (seen with LVH strain or digitalis effect). She denies chest pain. Due to her liver issues, she feels full and thus can feel a little SOB after eating a large meal, but overall denies SOB. She is not particularly active, but can do her own shopping and vacuuming. She has chronic LE edema which is actually better than usual.  She denies palpitations, syncope. No prior stress or cath.   Echo on 12/22/11 ([facility]) showed: Mild concentric LVH, hyperdynamic normal left ventricular systolic function, very mild outflow tract gradient at rest (21 mmHg) with no significant change with Valsalva, trace MR, mild aortic sclerosis with good trileaflet mobility, mild TR with normal pulmonary pressures.   Preoperative CXR and labs noted. Unfortunately, HFP weren't done at [facility].  I spoke with patient who said they have not been significantly elevated in the past.  I will request GI records. A1C was 5.6 in 11/2013.  Velna Ochsllison Zelenak, PA-C [facility]esthesiology Phone [phone] 01/19/2014 5:30 PM  Addendum: 01/24/2014 5:40 PM I had to  re-request GI records today, but did receive notes from Dr. Baltazar NajjarHurrelbrink's office.  The last office visit sent was from 09/2012, although he did have her undergo an abdominal ultrasound earlier this year (see below).  As of 09/2012, she was classified as having NASH with cirrhosis Grade 3 stage 4 on 08/24/12 biopsy.  There was no evidence of esophageal varices or ascites.  Weight loss was recommended.  Her liver disease was well compensated, so referral for liver transplant was not felt warranted at that time.  Abdominal ultrasound on 09/28/13 that showed: Hepatomegaly with changes of cirrhosis. No focal hepatic lesion. Prior cholecystectomy.   I reviewed above with anesthesiologist Dr. Gelene MinkFrederick. Agree with need for HFP preoperatively. No HFP received from GI. If Dr. Valentina LucksGriffin doesn't have a recent HFP then I will see if patient can come in for lab only appointment (versus same day labs).  With her liver disease and other co-morbidities, she will also need either medical or GI clearance. I have left a message with Agustin CreeKathy Blume at Dr. Nolon Nationsalldorf's office.  Based on currently available information, Dr. Gelene MinkFrederick thought she may be a candidate for spinal anesthesia, but ultimate anesthesia plan to be determined on the day of surgery.  Addendum: 01/29/14 5:23 PM Patient's LFTs this morning were WNL. Olegario MessierKathy from Dr. Nolon Nationsalldorf's office had communicated with Dr. Jone BasemanGriffin's office last week and today regarding need for medical clearance.  As of 5PM, she was still awaiting response so I talked with Dr. Jerl Santosalldorf.  He actually called and spoke with Dr. Kirby FunkJohn Griffin who told Dr. Jerl Santosalldorf that he had "no objections" to patient undergoing planned procedure.  Dr. Valentina LucksGriffin actually thought his office had already faxed a  clearance note last week. I did notify Dr. Jerl Santosalldorf that at least one of our anesthesiologists thought spinal anesthesia may be preferred over GA in a patient with her history, but definitive anesthesia plan will determined  by her assigned anesthesiologists on the day of surgery. Anticipate that she can proceed as planned.

## 2014-01-24 ENCOUNTER — Encounter (HOSPITAL_COMMUNITY): Payer: Self-pay

## 2014-01-29 ENCOUNTER — Encounter (HOSPITAL_COMMUNITY)
Admission: RE | Admit: 2014-01-29 | Discharge: 2014-01-29 | Disposition: A | Discharge status: Home / Self Care | Payer: Medicare Other | Source: Ambulatory Visit | Attending: Orthopaedic Surgery | Admitting: Orthopaedic Surgery

## 2014-01-29 LAB — HEPATIC FUNCTION PANEL
ALK PHOS: 104 U/L (ref 39–117)
ALT: 19 U/L (ref 0–35)
AST: 28 U/L (ref 0–37)
Albumin: 3.6 g/dL (ref 3.5–5.2)
Bilirubin, Direct: <0.2 mg/dL (ref 0.0–0.3)
Total Bilirubin: 0.5 mg/dL (ref 0.3–1.2)
Total Protein: 7.8 g/dL (ref 6.0–8.3)

## 2014-01-29 MED ORDER — CEFAZOLIN SODIUM-DEXTROSE 2-3 GM-% IV SOLR
2.0000 g | INTRAVENOUS | Status: AC
  Administered 2014-01-30: 2 g via INTRAVENOUS

## 2014-01-29 NOTE — H&P (Signed)
TOTAL KNEE ADMISSION H&P  Patient is being admitted for left total knee arthroplasty.  Subjective:  Chief Complaint:left knee pain.  HPI: Victoria BunnellRose M Kaiser, 58 y.o. female, has a history of pain and functional disability in the left knee due to arthritis and has failed non-surgical conservative treatments for greater than 12 weeks to includeNSAID's and/or analgesics, corticosteriod injections, viscosupplementation injections, use of assistive devices and activity modification.  Onset of symptoms was gradual, starting 4 years ago with gradually worsening course since that time. The patient noted no past surgery on the left knee(s).  Patient currently rates pain in the left knee(s) at 10 out of 10 with activity. Patient has night pain, worsening of pain with activity and weight bearing, pain that interferes with activities of daily living and pain with passive range of motion.  Patient has evidence of subchondral sclerosis, periarticular osteophytes and joint space narrowing by imaging studies. This patient has had no. There is no active infection.  Patient Active Problem List   Diagnosis Date Noted  . Polyneuropathy in diabetes(357.2) 08/03/2012  . Restless legs syndrome (RLS) 08/03/2012   Past Medical History  Diagnosis Date  . Restless leg syndrome   . Obesity   . Metabolic syndrome   . Hypertension   . Diabetes   . Dyslipidemia   . Bipolar disorder   . GERD (gastroesophageal reflux disease)   . Chronic low back pain   . Chronic anemia   . Vitamin D deficiency   . Splenomegaly   . 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 Susz[facility])  . Diabetic peripheral neuropathy     Probable  . IBS (irritable bowel syndrome)   . Complication of anesthesia   . PONV (postoperative nausea and vomiting)   . Nausea and/or vomiting     NO DEFINITIVE ANSWER--HAD TESTS RUN--NOTHING SHOWS UP  . Liver cirrhosis secondary to  NASH (nonalcoholic steatohepatitis)     Grade 3 stage 4 on biopsy 08/24/12 (Dr. Marcelene ButteHurrelbrink)    Past Surgical History  Procedure Laterality Date  . Laminectomy lumbar spline w/ placement spinal cord stimulator    . Lumbar disc surgery      On 2 occasions  . Cervical spine surgery    . Abdominal hysterectomy    . Ovarian cyst surgery      For polycystic ovarian disease  . Gallbladder surgery    . Finger surgery Left     Trauma to the left index finger  .  ovaries removed after hysterectomy      No prescriptions prior to admission   Allergies  Allergen Reactions  . Aspirin Rash    History  Substance Use Topics  . Smoking status: Former Smoker -- 1.00 packs/day for 5 years    Types: Cigarettes    Quit date: 07/27/2001  . Smokeless tobacco: Not on file  . Alcohol Use: No    Family History  Problem Relation Age of Onset  . Hypertension Mother   . Coronary artery disease Mother   . Heart attack Maternal Grandmother      Review of Systems  Constitutional: Negative.   HENT: Negative.   Eyes: Negative.   Respiratory: Negative.   Cardiovascular: Negative.   Gastrointestinal: Negative.   Genitourinary: Negative.   Musculoskeletal: Positive for joint pain.  Skin: Negative.   Neurological: Negative.   Psychiatric/Behavioral: Negative.     Objective:  Physical Exam  Constitutional: She  appears well-developed.  HENT:  Head: Normocephalic.  Eyes: Pupils are equal, round, and reactive to light.  Neck: Normal range of motion.  Cardiovascular: Normal rate.   Respiratory: Effort normal.  GI: Soft.  Musculoskeletal:  Left knee exam: Range of motion 0-1 20.  No significant effusion.  Crepitation with stair pain medial joint line to palpation.  Calf soft and nontender.  Good neurovascular status  Neurological: She is alert.  Skin: Skin is warm.  Psychiatric: She has a normal mood and affect.    Vital signs in last 24 hours:    Labs:   Estimated body mass index is  42.43 kg/(m^2) as calculated from the following:   Height as of 08/03/12: 5' 1.75" (1.568 m).   Weight as of 11/24/12: 104.327 kg (230 lb).   Imaging Review Plain radiographs demonstrate severe degenerative joint disease of the left knee(s). The overall alignment isneutral. The bone quality appears to be good for age and reported activity level.  Assessment/Plan:  End stage arthritis, left knee   The patient history, physical examination, clinical judgment of the provider and imaging studies are consistent with end stage degenerative joint disease of the left knee(s) and total knee arthroplasty is deemed medically necessary. The treatment options including medical management, injection therapy arthroscopy and arthroplasty were discussed at length. The risks and benefits of total knee arthroplasty were presented and reviewed. The risks due to aseptic loosening, infection, stiffness, patella tracking problems, thromboembolic complications and other imponderables were discussed. The patient acknowledged the explanation, agreed to proceed with the plan and consent was signed. Patient is being admitted for inpatient treatment for surgery, pain control, PT, OT, prophylactic antibiotics, VTE prophylaxis, progressive ambulation and ADL's and discharge planning. The patient is planning to be discharged home with home health services

## 2014-01-30 ENCOUNTER — Inpatient Hospital Stay (HOSPITAL_COMMUNITY): Payer: Medicare Other | Admitting: Anesthesiology

## 2014-01-30 ENCOUNTER — Inpatient Hospital Stay (HOSPITAL_COMMUNITY)
Admission: RE | Admit: 2014-01-30 | Discharge: 2014-02-02 | DRG: 470 | Disposition: A | Discharge status: Home Health | Payer: Medicare Other | Source: Ambulatory Visit | Attending: Orthopaedic Surgery | Admitting: Orthopaedic Surgery

## 2014-01-30 ENCOUNTER — Encounter (HOSPITAL_COMMUNITY)
Admission: RE | Disposition: A | Discharge status: Home Health | Payer: Self-pay | Source: Ambulatory Visit | Attending: Orthopaedic Surgery

## 2014-01-30 ENCOUNTER — Encounter (HOSPITAL_COMMUNITY): Payer: Self-pay | Admitting: *Deleted

## 2014-01-30 ENCOUNTER — Encounter (HOSPITAL_COMMUNITY): Payer: Medicare Other | Admitting: Vascular Surgery

## 2014-01-30 DIAGNOSIS — M1712 Unilateral primary osteoarthritis, left knee: Secondary | ICD-10-CM | POA: Diagnosis present

## 2014-01-30 DIAGNOSIS — E8881 Metabolic syndrome: Secondary | ICD-10-CM | POA: Diagnosis present

## 2014-01-30 DIAGNOSIS — M545 Low back pain, unspecified: Secondary | ICD-10-CM | POA: Diagnosis present

## 2014-01-30 DIAGNOSIS — K7460 Unspecified cirrhosis of liver: Secondary | ICD-10-CM | POA: Diagnosis present

## 2014-01-30 DIAGNOSIS — M1710 Unilateral primary osteoarthritis, unspecified knee: Principal | ICD-10-CM | POA: Diagnosis present

## 2014-01-30 DIAGNOSIS — E1149 Type 2 diabetes mellitus with other diabetic neurological complication: Secondary | ICD-10-CM | POA: Diagnosis present

## 2014-01-30 DIAGNOSIS — Z886 Allergy status to analgesic agent status: Secondary | ICD-10-CM

## 2014-01-30 DIAGNOSIS — Z7901 Long term (current) use of anticoagulants: Secondary | ICD-10-CM

## 2014-01-30 DIAGNOSIS — K219 Gastro-esophageal reflux disease without esophagitis: Secondary | ICD-10-CM | POA: Diagnosis present

## 2014-01-30 DIAGNOSIS — E1142 Type 2 diabetes mellitus with diabetic polyneuropathy: Secondary | ICD-10-CM | POA: Diagnosis present

## 2014-01-30 DIAGNOSIS — Z79899 Other long term (current) drug therapy: Secondary | ICD-10-CM

## 2014-01-30 DIAGNOSIS — Z87891 Personal history of nicotine dependence: Secondary | ICD-10-CM

## 2014-01-30 DIAGNOSIS — E785 Hyperlipidemia, unspecified: Secondary | ICD-10-CM | POA: Diagnosis present

## 2014-01-30 DIAGNOSIS — G8929 Other chronic pain: Secondary | ICD-10-CM | POA: Diagnosis present

## 2014-01-30 DIAGNOSIS — E559 Vitamin D deficiency, unspecified: Secondary | ICD-10-CM | POA: Diagnosis present

## 2014-01-30 DIAGNOSIS — F319 Bipolar disorder, unspecified: Secondary | ICD-10-CM | POA: Diagnosis present

## 2014-01-30 DIAGNOSIS — Z8249 Family history of ischemic heart disease and other diseases of the circulatory system: Secondary | ICD-10-CM

## 2014-01-30 DIAGNOSIS — I10 Essential (primary) hypertension: Secondary | ICD-10-CM | POA: Diagnosis present

## 2014-01-30 DIAGNOSIS — K589 Irritable bowel syndrome without diarrhea: Secondary | ICD-10-CM | POA: Diagnosis present

## 2014-01-30 DIAGNOSIS — E119 Type 2 diabetes mellitus without complications: Secondary | ICD-10-CM | POA: Diagnosis present

## 2014-01-30 DIAGNOSIS — Z6841 Body Mass Index (BMI) 40.0 and over, adult: Secondary | ICD-10-CM

## 2014-01-30 DIAGNOSIS — G2581 Restless legs syndrome: Secondary | ICD-10-CM | POA: Diagnosis present

## 2014-01-30 HISTORY — PX: TOTAL KNEE ARTHROPLASTY: SHX125

## 2014-01-30 LAB — GLUCOSE, CAPILLARY
Glucose-Capillary: 108 mg/dL — ABNORMAL HIGH (ref 70–99)
Glucose-Capillary: 162 mg/dL — ABNORMAL HIGH (ref 70–99)
Glucose-Capillary: 174 mg/dL — ABNORMAL HIGH (ref 70–99)
Glucose-Capillary: 116 mg/dL — ABNORMAL HIGH (ref 70–99)

## 2014-01-30 LAB — CBC
HCT: 33.1 % — ABNORMAL LOW (ref 36.0–46.0)
Hemoglobin: 10.2 g/dL — ABNORMAL LOW (ref 12.0–15.0)
MCH: 26.5 pg (ref 26.0–34.0)
MCHC: 30.8 g/dL (ref 30.0–36.0)
MCV: 86 fL (ref 78.0–100.0)
Platelets: 120 10*3/uL — ABNORMAL LOW (ref 150–400)
RBC: 3.85 MIL/uL — ABNORMAL LOW (ref 3.87–5.11)
RDW: 15.4 % (ref 11.5–15.5)
WBC: 7.8 10*3/uL (ref 4.0–10.5)

## 2014-01-30 LAB — CREATININE, SERUM
Creatinine, Ser: 0.88 mg/dL (ref 0.50–1.10)
GFR calc non Af Amer: 72 mL/min — ABNORMAL LOW (ref >90)
GFR, EST AFRICAN AMERICAN: 83 mL/min — AB (ref >90)

## 2014-01-30 SURGERY — ARTHROPLASTY, KNEE, TOTAL
Anesthesia: General | Site: Knee | Laterality: Left

## 2014-01-30 MED ORDER — CHLORHEXIDINE GLUCONATE 4 % EX LIQD
60.0000 mL | Freq: Once | CUTANEOUS | Status: DC
  Filled 2014-01-30: qty 60

## 2014-01-30 MED ORDER — DIAZEPAM 5 MG PO TABS
5.0000 mg | ORAL_TABLET | Freq: Three times a day (TID) | ORAL | Status: DC | PRN
  Administered 2014-01-31: 5 mg via ORAL
  Filled 2014-01-30: qty 1

## 2014-01-30 MED ORDER — DEXAMETHASONE SODIUM PHOSPHATE 4 MG/ML IJ SOLN
INTRAMUSCULAR | Status: AC
  Filled 2014-01-30: qty 1

## 2014-01-30 MED ORDER — ALPRAZOLAM 0.5 MG PO TABS
0.5000 mg | ORAL_TABLET | Freq: Every evening | ORAL | Status: DC | PRN
  Administered 2014-01-31: 0.5 mg via ORAL
  Filled 2014-01-30: qty 1

## 2014-01-30 MED ORDER — PIOGLITAZONE HCL 45 MG PO TABS
22.5000 mg | ORAL_TABLET | Freq: Every day | ORAL | Status: DC
  Administered 2014-01-31 – 2014-02-02 (×3): 22.5 mg via ORAL
  Filled 2014-01-30 (×5): qty 0.5

## 2014-01-30 MED ORDER — GLYCOPYRROLATE 0.2 MG/ML IJ SOLN
INTRAMUSCULAR | Status: DC | PRN
  Administered 2014-01-30: .4 mg via INTRAVENOUS

## 2014-01-30 MED ORDER — SUCCINYLCHOLINE CHLORIDE 20 MG/ML IJ SOLN
INTRAMUSCULAR | Status: DC | PRN
  Administered 2014-01-30: 120 mg via INTRAVENOUS

## 2014-01-30 MED ORDER — HYDROMORPHONE HCL PF 1 MG/ML IJ SOLN
INTRAMUSCULAR | Status: AC
  Filled 2014-01-30: qty 2

## 2014-01-30 MED ORDER — PANTOPRAZOLE SODIUM 40 MG PO TBEC
80.0000 mg | DELAYED_RELEASE_TABLET | Freq: Every day | ORAL | Status: DC
  Administered 2014-01-31 – 2014-02-02 (×3): 80 mg via ORAL
  Filled 2014-01-30 (×3): qty 2

## 2014-01-30 MED ORDER — GLIPIZIDE ER 10 MG PO TB24
10.0000 mg | ORAL_TABLET | Freq: Every day | ORAL | Status: DC
  Administered 2014-01-31 – 2014-02-02 (×3): 10 mg via ORAL
  Filled 2014-01-30 (×4): qty 1

## 2014-01-30 MED ORDER — LEVOTHYROXINE SODIUM 125 MCG PO TABS: 125.0000 ug | ORAL_TABLET | Freq: Every day | ORAL | Status: DC

## 2014-01-30 MED ORDER — ARTIFICIAL TEARS OP OINT
TOPICAL_OINTMENT | OPHTHALMIC | Status: AC
Start: 2014-01-30 — End: 2014-01-30
  Filled 2014-01-30: qty 3.5

## 2014-01-30 MED ORDER — BUPROPION HCL ER (XL) 300 MG PO TB24
300.0000 mg | ORAL_TABLET | Freq: Every day | ORAL | Status: DC
  Administered 2014-01-31 – 2014-02-02 (×3): 300 mg via ORAL
  Filled 2014-01-30 (×3): qty 1

## 2014-01-30 MED ORDER — PHENYLEPHRINE 40 MCG/ML (10ML) SYRINGE FOR IV PUSH (FOR BLOOD PRESSURE SUPPORT)
PREFILLED_SYRINGE | INTRAVENOUS | Status: AC
  Filled 2014-01-30: qty 10

## 2014-01-30 MED ORDER — PROPOFOL 10 MG/ML IV BOLUS
INTRAVENOUS | Status: DC | PRN
  Administered 2014-01-30: 200 mg via INTRAVENOUS

## 2014-01-30 MED ORDER — MIDAZOLAM HCL 2 MG/2ML IJ SOLN
INTRAMUSCULAR | Status: AC
  Filled 2014-01-30: qty 2

## 2014-01-30 MED ORDER — WARFARIN - PHARMACIST DOSING INPATIENT
Freq: Every day | Status: DC
  Administered 2014-02-01: 18:00:00

## 2014-01-30 MED ORDER — PHENYLEPHRINE HCL 10 MG/ML IJ SOLN
INTRAMUSCULAR | Status: DC | PRN
  Administered 2014-01-30: 80 ug via INTRAVENOUS

## 2014-01-30 MED ORDER — BUPIVACAINE LIPOSOME 1.3 % IJ SUSP
20.0000 mL | Freq: Once | INTRAMUSCULAR | Status: DC
  Filled 2014-01-30: qty 20

## 2014-01-30 MED ORDER — METOCLOPRAMIDE HCL 5 MG/ML IJ SOLN
INTRAMUSCULAR | Status: AC
Start: 2014-01-30 — End: 2014-01-30
  Filled 2014-01-30: qty 2

## 2014-01-30 MED ORDER — LACTATED RINGERS IV SOLN
INTRAVENOUS | Status: DC
  Administered 2014-01-30 – 2014-01-31 (×2): via INTRAVENOUS

## 2014-01-30 MED ORDER — LIDOCAINE HCL (CARDIAC) 20 MG/ML IV SOLN
INTRAVENOUS | Status: AC
  Filled 2014-01-30: qty 5

## 2014-01-30 MED ORDER — OXYCODONE HCL 5 MG/5ML PO SOLN: 5.0000 mg | Freq: Once | ORAL | Status: AC | PRN

## 2014-01-30 MED ORDER — METOCLOPRAMIDE HCL 5 MG/ML IJ SOLN
INTRAMUSCULAR | Status: DC | PRN
  Administered 2014-01-30: 10 mg via INTRAVENOUS

## 2014-01-30 MED ORDER — MIDAZOLAM HCL 5 MG/5ML IJ SOLN
INTRAMUSCULAR | Status: DC | PRN
  Administered 2014-01-30: 2 mg via INTRAVENOUS

## 2014-01-30 MED ORDER — HYDROMORPHONE HCL PF 1 MG/ML IJ SOLN
0.2500 mg | INTRAMUSCULAR | Status: DC | PRN
  Administered 2014-01-30: 1 mg via INTRAVENOUS
  Administered 2014-01-30: 0.5 mg via INTRAVENOUS
  Administered 2014-01-30 (×2): 1 mg via INTRAVENOUS
  Administered 2014-01-30: 0.5 mg via INTRAVENOUS
  Administered 2014-01-30 (×2): 1 mg via INTRAVENOUS

## 2014-01-30 MED ORDER — METHOCARBAMOL 1000 MG/10ML IJ SOLN
500.0000 mg | Freq: Four times a day (QID) | INTRAMUSCULAR | Status: DC | PRN
  Filled 2014-01-30: qty 5

## 2014-01-30 MED ORDER — OXYCODONE-ACETAMINOPHEN 5-325 MG PO TABS
2.0000 | ORAL_TABLET | ORAL | Status: DC | PRN
  Administered 2014-01-30 – 2014-01-31 (×2): 2 via ORAL
  Filled 2014-01-30 (×2): qty 2

## 2014-01-30 MED ORDER — LEVOTHYROXINE SODIUM 125 MCG PO TABS
125.0000 ug | ORAL_TABLET | Freq: Every day | ORAL | Status: DC
  Administered 2014-01-31 – 2014-02-02 (×3): 125 ug via ORAL
  Filled 2014-01-30 (×5): qty 1

## 2014-01-30 MED ORDER — TRANEXAMIC ACID 100 MG/ML IV SOLN
1000.0000 mg | INTRAVENOUS | Status: AC
  Administered 2014-01-30: 1000 mg via INTRAVENOUS
  Filled 2014-01-30: qty 10

## 2014-01-30 MED ORDER — ASENAPINE MALEATE 5 MG SL SUBL
5.0000 mg | SUBLINGUAL_TABLET | Freq: Every day | SUBLINGUAL | Status: DC
  Administered 2014-01-31 – 2014-02-02 (×3): 5 mg via SUBLINGUAL
  Filled 2014-01-30: qty 1

## 2014-01-30 MED ORDER — METOCLOPRAMIDE HCL 10 MG PO TABS: 5.0000 mg | ORAL_TABLET | Freq: Three times a day (TID) | ORAL | Status: DC | PRN

## 2014-01-30 MED ORDER — DEXAMETHASONE SODIUM PHOSPHATE 4 MG/ML IJ SOLN
INTRAMUSCULAR | Status: DC | PRN
  Administered 2014-01-30: 4 mg via INTRAVENOUS

## 2014-01-30 MED ORDER — ONDANSETRON HCL 4 MG/2ML IJ SOLN
INTRAMUSCULAR | Status: AC
  Filled 2014-01-30: qty 2

## 2014-01-30 MED ORDER — CEFAZOLIN SODIUM-DEXTROSE 2-3 GM-% IV SOLR
2.0000 g | Freq: Four times a day (QID) | INTRAVENOUS | Status: AC
  Administered 2014-01-30 (×2): 2 g via INTRAVENOUS
  Filled 2014-01-30 (×2): qty 50

## 2014-01-30 MED ORDER — OXYCODONE HCL 5 MG PO TABS
ORAL_TABLET | ORAL | Status: AC
  Filled 2014-01-30: qty 1

## 2014-01-30 MED ORDER — PROPOFOL 10 MG/ML IV BOLUS
INTRAVENOUS | Status: AC
Start: 2014-01-30 — End: 2014-01-30
  Filled 2014-01-30: qty 20

## 2014-01-30 MED ORDER — METFORMIN HCL 500 MG PO TABS
1000.0000 mg | ORAL_TABLET | Freq: Two times a day (BID) | ORAL | Status: DC
  Administered 2014-01-30 – 2014-02-02 (×5): 1000 mg via ORAL
  Filled 2014-01-30 (×8): qty 2

## 2014-01-30 MED ORDER — ONDANSETRON HCL 4 MG PO TABS
4.0000 mg | ORAL_TABLET | Freq: Four times a day (QID) | ORAL | Status: DC | PRN
  Administered 2014-02-02: 4 mg via ORAL
  Filled 2014-01-30: qty 1

## 2014-01-30 MED ORDER — DICYCLOMINE HCL 20 MG PO TABS
20.0000 mg | ORAL_TABLET | Freq: Three times a day (TID) | ORAL | Status: DC
  Administered 2014-01-30 – 2014-02-02 (×10): 20 mg via ORAL
  Filled 2014-01-30 (×12): qty 1

## 2014-01-30 MED ORDER — HYDROCODONE-ACETAMINOPHEN 5-325 MG PO TABS
1.0000 | ORAL_TABLET | ORAL | Status: DC | PRN
  Administered 2014-01-30 (×2): 2 via ORAL
  Filled 2014-01-30 (×2): qty 2

## 2014-01-30 MED ORDER — HYDROMORPHONE HCL PF 1 MG/ML IJ SOLN
0.5000 mg | INTRAMUSCULAR | Status: DC | PRN
  Administered 2014-01-30 – 2014-01-31 (×3): 1 mg via INTRAVENOUS
  Filled 2014-01-30 (×4): qty 1

## 2014-01-30 MED ORDER — NEOSTIGMINE METHYLSULFATE 10 MG/10ML IV SOLN
INTRAVENOUS | Status: AC
  Filled 2014-01-30: qty 3

## 2014-01-30 MED ORDER — FUROSEMIDE 40 MG PO TABS
40.0000 mg | ORAL_TABLET | Freq: Every day | ORAL | Status: DC
  Administered 2014-01-31 – 2014-02-02 (×3): 40 mg via ORAL
  Filled 2014-01-30 (×3): qty 1

## 2014-01-30 MED ORDER — FENTANYL CITRATE 0.05 MG/ML IJ SOLN
INTRAMUSCULAR | Status: AC
  Filled 2014-01-30: qty 5

## 2014-01-30 MED ORDER — LEVOTHYROXINE SODIUM 100 MCG PO TABS
100.0000 ug | ORAL_TABLET | Freq: Every day | ORAL | Status: DC
  Administered 2014-01-31 – 2014-02-02 (×3): 100 ug via ORAL
  Filled 2014-01-30 (×7): qty 1

## 2014-01-30 MED ORDER — OXYCODONE HCL 5 MG PO TABS
5.0000 mg | ORAL_TABLET | Freq: Once | ORAL | Status: AC | PRN
  Administered 2014-01-30: 5 mg via ORAL

## 2014-01-30 MED ORDER — ACETAMINOPHEN 650 MG RE SUPP: 650.0000 mg | Freq: Four times a day (QID) | RECTAL | Status: DC | PRN

## 2014-01-30 MED ORDER — BISACODYL 5 MG PO TBEC: 5.0000 mg | DELAYED_RELEASE_TABLET | Freq: Every day | ORAL | Status: DC | PRN

## 2014-01-30 MED ORDER — PHENOL 1.4 % MT LIQD: 1.0000 | OROMUCOSAL | Status: DC | PRN

## 2014-01-30 MED ORDER — 0.9 % SODIUM CHLORIDE (POUR BTL) OPTIME
TOPICAL | Status: DC | PRN
  Administered 2014-01-30: 1000 mL

## 2014-01-30 MED ORDER — HYDROMORPHONE HCL PF 1 MG/ML IJ SOLN
INTRAMUSCULAR | Status: AC
  Filled 2014-01-30: qty 1

## 2014-01-30 MED ORDER — METOCLOPRAMIDE HCL 5 MG/ML IJ SOLN: 10.0000 mg | Freq: Once | INTRAMUSCULAR | Status: DC | PRN

## 2014-01-30 MED ORDER — NEOSTIGMINE METHYLSULFATE 10 MG/10ML IV SOLN
INTRAVENOUS | Status: DC | PRN
  Administered 2014-01-30: 3 mg via INTRAVENOUS

## 2014-01-30 MED ORDER — MENTHOL 3 MG MT LOZG: 1.0000 | LOZENGE | OROMUCOSAL | Status: DC | PRN

## 2014-01-30 MED ORDER — INSULIN ASPART 100 UNIT/ML ~~LOC~~ SOLN
0.0000 [IU] | Freq: Three times a day (TID) | SUBCUTANEOUS | Status: DC
  Administered 2014-01-30 (×2): 4 [IU] via SUBCUTANEOUS
  Administered 2014-01-31 – 2014-02-01 (×3): 3 [IU] via SUBCUTANEOUS

## 2014-01-30 MED ORDER — WARFARIN SODIUM 7.5 MG PO TABS
7.5000 mg | ORAL_TABLET | Freq: Once | ORAL | Status: AC
  Administered 2014-01-30: 7.5 mg via ORAL
  Filled 2014-01-30: qty 1

## 2014-01-30 MED ORDER — COUMADIN BOOK
Freq: Once | Status: AC
  Administered 2014-01-30: 12:00:00
  Filled 2014-01-30: qty 1

## 2014-01-30 MED ORDER — MORPHINE SULFATE 15 MG PO TABS
15.0000 mg | ORAL_TABLET | Freq: Three times a day (TID) | ORAL | Status: DC
  Administered 2014-01-30 – 2014-02-02 (×7): 15 mg via ORAL
  Filled 2014-01-30 (×7): qty 1

## 2014-01-30 MED ORDER — ROPINIROLE HCL 1 MG PO TABS
2.0000 mg | ORAL_TABLET | Freq: Three times a day (TID) | ORAL | Status: DC
  Administered 2014-01-30 – 2014-02-02 (×9): 2 mg via ORAL
  Filled 2014-01-30 (×11): qty 2

## 2014-01-30 MED ORDER — ROCURONIUM BROMIDE 100 MG/10ML IV SOLN
INTRAVENOUS | Status: DC | PRN
  Administered 2014-01-30: 50 mg via INTRAVENOUS

## 2014-01-30 MED ORDER — ONDANSETRON HCL 4 MG/2ML IJ SOLN
4.0000 mg | Freq: Four times a day (QID) | INTRAMUSCULAR | Status: DC | PRN
  Administered 2014-02-01: 4 mg via INTRAVENOUS
  Filled 2014-01-30: qty 2

## 2014-01-30 MED ORDER — ALUM & MAG HYDROXIDE-SIMETH 200-200-20 MG/5ML PO SUSP: 30.0000 mL | ORAL | Status: DC | PRN

## 2014-01-30 MED ORDER — ENOXAPARIN SODIUM 30 MG/0.3ML ~~LOC~~ SOLN
30.0000 mg | Freq: Two times a day (BID) | SUBCUTANEOUS | Status: DC
  Administered 2014-01-31 – 2014-02-02 (×5): 30 mg via SUBCUTANEOUS
  Filled 2014-01-30 (×7): qty 0.3

## 2014-01-30 MED ORDER — DEXTROSE 5 % IV SOLN
500.0000 mg | INTRAVENOUS | Status: AC
  Administered 2014-01-30: 500 mg via INTRAVENOUS
  Filled 2014-01-30: qty 5

## 2014-01-30 MED ORDER — DIPHENHYDRAMINE HCL 12.5 MG/5ML PO ELIX: 12.5000 mg | ORAL_SOLUTION | ORAL | Status: DC | PRN

## 2014-01-30 MED ORDER — LACTATED RINGERS IV SOLN
INTRAVENOUS | Status: DC
Start: 2014-01-30 — End: 2014-01-30
  Administered 2014-01-30 (×2): via INTRAVENOUS

## 2014-01-30 MED ORDER — LEVOTHYROXINE SODIUM 100 MCG PO TABS: 100.0000 ug | ORAL_TABLET | Freq: Every day | ORAL | Status: DC

## 2014-01-30 MED ORDER — BUPIVACAINE LIPOSOME 1.3 % IJ SUSP
INTRAMUSCULAR | Status: DC | PRN
  Administered 2014-01-30: 20 mL

## 2014-01-30 MED ORDER — LIDOCAINE HCL (CARDIAC) 20 MG/ML IV SOLN
INTRAVENOUS | Status: DC | PRN
  Administered 2014-01-30: 70 mg via INTRAVENOUS

## 2014-01-30 MED ORDER — METHOCARBAMOL 500 MG PO TABS
500.0000 mg | ORAL_TABLET | Freq: Four times a day (QID) | ORAL | Status: DC | PRN
  Administered 2014-01-30 – 2014-02-02 (×4): 500 mg via ORAL
  Filled 2014-01-30 (×7): qty 1

## 2014-01-30 MED ORDER — ACETAMINOPHEN 325 MG PO TABS: 650.0000 mg | ORAL_TABLET | Freq: Four times a day (QID) | ORAL | Status: DC | PRN

## 2014-01-30 MED ORDER — METOCLOPRAMIDE HCL 5 MG/ML IJ SOLN: 5.0000 mg | Freq: Three times a day (TID) | INTRAMUSCULAR | Status: DC | PRN

## 2014-01-30 MED ORDER — DOCUSATE SODIUM 100 MG PO CAPS
100.0000 mg | ORAL_CAPSULE | Freq: Two times a day (BID) | ORAL | Status: DC
  Administered 2014-01-30 – 2014-02-02 (×6): 100 mg via ORAL
  Filled 2014-01-30 (×9): qty 1

## 2014-01-30 MED ORDER — WARFARIN VIDEO: Freq: Once | Status: DC

## 2014-01-30 MED ORDER — POTASSIUM CHLORIDE CRYS ER 20 MEQ PO TBCR
20.0000 meq | EXTENDED_RELEASE_TABLET | Freq: Every day | ORAL | Status: DC
  Administered 2014-01-30 – 2014-02-02 (×4): 20 meq via ORAL
  Filled 2014-01-30 (×4): qty 1

## 2014-01-30 MED ORDER — GLYCOPYRROLATE 0.2 MG/ML IJ SOLN
INTRAMUSCULAR | Status: AC
  Filled 2014-01-30: qty 2

## 2014-01-30 MED ORDER — FENTANYL CITRATE 0.05 MG/ML IJ SOLN
INTRAMUSCULAR | Status: DC | PRN
  Administered 2014-01-30 (×2): 100 ug via INTRAVENOUS
  Administered 2014-01-30: 150 ug via INTRAVENOUS

## 2014-01-30 MED ORDER — SODIUM CHLORIDE 0.9 % IR SOLN
Status: DC | PRN
  Administered 2014-01-30: 1000 mL

## 2014-01-30 MED ORDER — FERROUS GLUCONATE 324 (38 FE) MG PO TABS
324.0000 mg | ORAL_TABLET | Freq: Three times a day (TID) | ORAL | Status: DC
  Administered 2014-01-30 – 2014-02-02 (×10): 324 mg via ORAL
  Filled 2014-01-30 (×12): qty 1

## 2014-01-30 MED ORDER — ONDANSETRON HCL 4 MG/2ML IJ SOLN
INTRAMUSCULAR | Status: DC | PRN
  Administered 2014-01-30: 4 mg via INTRAVENOUS

## 2014-01-30 SURGICAL SUPPLY — 63 items
BANDAGE ELASTIC 4 VELCRO ST LF (GAUZE/BANDAGES/DRESSINGS) ×3 IMPLANT
BANDAGE ESMARK 6X9 LF (GAUZE/BANDAGES/DRESSINGS) ×1 IMPLANT
BENZOIN TINCTURE PRP APPL 2/3 (GAUZE/BANDAGES/DRESSINGS) ×3 IMPLANT
BLADE SAGITTAL 25.0X1.19X90 (BLADE) ×2 IMPLANT
BLADE SAGITTAL 25.0X1.19X90MM (BLADE) ×1
BLADE SURG ROTATE 9660 (MISCELLANEOUS) IMPLANT
BNDG ELASTIC 6X10 VLCR STRL LF (GAUZE/BANDAGES/DRESSINGS) ×3 IMPLANT
BNDG ESMARK 6X9 LF (GAUZE/BANDAGES/DRESSINGS) ×3
BNDG GAUZE ELAST 4 BULKY (GAUZE/BANDAGES/DRESSINGS) ×3 IMPLANT
BOWL SMART MIX CTS (DISPOSABLE) ×3 IMPLANT
CAP UPCHARGE REVISION TRAY ×3 IMPLANT
CAPT RP KNEE ×3 IMPLANT
CEMENT HV SMART SET (Cement) ×6 IMPLANT
CLOSURE WOUND 1/2 X4 (GAUZE/BANDAGES/DRESSINGS) ×1
COVER SURGICAL LIGHT HANDLE (MISCELLANEOUS) ×3 IMPLANT
CUFF TOURNIQUET SINGLE 34IN LL (TOURNIQUET CUFF) ×3 IMPLANT
CUFF TOURNIQUET SINGLE 44IN (TOURNIQUET CUFF) IMPLANT
DRAPE EXTREMITY T 121X128X90 (DRAPE) ×3 IMPLANT
DRAPE PROXIMA HALF (DRAPES) ×3 IMPLANT
DRAPE U-SHAPE 47X51 STRL (DRAPES) ×3 IMPLANT
DRSG ADAPTIC 3X8 NADH LF (GAUZE/BANDAGES/DRESSINGS) ×3 IMPLANT
DRSG PAD ABDOMINAL 8X10 ST (GAUZE/BANDAGES/DRESSINGS) ×3 IMPLANT
DURAPREP 26ML APPLICATOR (WOUND CARE) ×3 IMPLANT
ELECT REM PT RETURN 9FT ADLT (ELECTROSURGICAL) ×3
ELECTRODE REM PT RTRN 9FT ADLT (ELECTROSURGICAL) ×1 IMPLANT
FACESHIELD WRAPAROUND (MASK) IMPLANT
GLOVE BIO SURGEON STRL SZ8 (GLOVE) ×6 IMPLANT
GLOVE BIOGEL PI IND STRL 8 (GLOVE) ×2 IMPLANT
GLOVE BIOGEL PI INDICATOR 8 (GLOVE) ×4
GLOVE SS BIOGEL STRL SZ 8 (GLOVE) ×2 IMPLANT
GLOVE SUPERSENSE BIOGEL SZ 8 (GLOVE) ×4
GOWN STRL REUS W/ TWL LRG LVL3 (GOWN DISPOSABLE) IMPLANT
GOWN STRL REUS W/ TWL XL LVL3 (GOWN DISPOSABLE) ×5 IMPLANT
GOWN STRL REUS W/TWL 2XL LVL3 (GOWN DISPOSABLE) IMPLANT
GOWN STRL REUS W/TWL LRG LVL3 (GOWN DISPOSABLE)
GOWN STRL REUS W/TWL XL LVL3 (GOWN DISPOSABLE) ×10
HANDPIECE INTERPULSE COAX TIP (DISPOSABLE) ×2
HOOD PEEL AWAY FACE SHEILD DIS (HOOD) ×6 IMPLANT
IMMOBILIZER KNEE 20 (SOFTGOODS) ×3 IMPLANT
IMMOBILIZER KNEE 22 UNIV (SOFTGOODS) IMPLANT
IMMOBILIZER KNEE 24 THIGH 36 (MISCELLANEOUS) IMPLANT
IMMOBILIZER KNEE 24 UNIV (MISCELLANEOUS)
KIT BASIN OR (CUSTOM PROCEDURE TRAY) ×3 IMPLANT
KIT ROOM TURNOVER OR (KITS) ×3 IMPLANT
MANIFOLD NEPTUNE II (INSTRUMENTS) ×3 IMPLANT
NEEDLE 22X1 1/2 (OR ONLY) (NEEDLE) ×3 IMPLANT
NS IRRIG 1000ML POUR BTL (IV SOLUTION) ×3 IMPLANT
PACK TOTAL JOINT (CUSTOM PROCEDURE TRAY) ×3 IMPLANT
PAD ARMBOARD 7.5X6 YLW CONV (MISCELLANEOUS) ×6 IMPLANT
SET HNDPC FAN SPRY TIP SCT (DISPOSABLE) ×1 IMPLANT
SPONGE GAUZE 4X4 12PLY (GAUZE/BANDAGES/DRESSINGS) ×3 IMPLANT
STAPLER VISISTAT 35W (STAPLE) IMPLANT
STRIP CLOSURE SKIN 1/2X4 (GAUZE/BANDAGES/DRESSINGS) ×2 IMPLANT
SUCTION FRAZIER TIP 10 FR DISP (SUCTIONS) ×3 IMPLANT
SUT MNCRL AB 3-0 PS2 18 (SUTURE) ×3 IMPLANT
SUT VIC AB 0 CT1 27 (SUTURE) ×4
SUT VIC AB 0 CT1 27XBRD ANBCTR (SUTURE) ×2 IMPLANT
SUT VIC AB 2-0 CT1 27 (SUTURE) ×4
SUT VIC AB 2-0 CT1 TAPERPNT 27 (SUTURE) ×2 IMPLANT
SUT VLOC 180 0 24IN GS25 (SUTURE) ×3 IMPLANT
SYR 50ML LL SCALE MARK (SYRINGE) ×3 IMPLANT
TOWEL OR 17X24 6PK STRL BLUE (TOWEL DISPOSABLE) ×3 IMPLANT
TOWEL OR 17X26 10 PK STRL BLUE (TOWEL DISPOSABLE) ×3 IMPLANT

## 2014-01-30 NOTE — Progress Notes (Signed)
ANTICOAGULATION CONSULT NOTE - Initial Consult  Pharmacy Consult for coumadin Indication: VTE prophylaxis  Allergies  Allergen Reactions  . Aspirin Rash    Patient Measurements:    Dosing Weight: 96.8 kg  Vital Signs: Temp: 98.6 F (37 C) (07/07 1140) Temp src: Oral (07/07 0600) BP: 140/71 mmHg (07/07 1140) Pulse Rate: 101 (07/07 1140)  Labs: No results found for this basename: HGB, HCT, PLT, APTT, LABPROT, INR, HEPARINUNFRC, CREATININE, CKTOTAL, CKMB, TROPONINI,  in the last 72 hours  The CrCl is unknown because both a height and weight (above a minimum accepted value) are required for this calculation.   Medical History: Past Medical History  Diagnosis Date  . Restless leg syndrome   . Obesity   . Metabolic syndrome   . Hypertension   . Diabetes   . Dyslipidemia   . Bipolar disorder   . GERD (gastroesophageal reflux disease)   . Chronic low back pain   . Chronic anemia   . Vitamin D deficiency   . Splenomegaly   . 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 Suszanne [facility]rel[facility]ink[facility])  . Diabetic peripheral neuropathy     Probable  . IBS (irritable bowel syndrome)   . Complication of anesthesia   . PONV (postoperative nausea and vomiting)   . Nausea and/or vomiting     NO DEFINITIVE ANSWER--HAD TESTS RUN--NOTHING SHOWS UP  . Liver cirrhosis secondary to NASH (nonalcoholic steatohepatitis)     Grade 3 stage 4 on biopsy 08/24/12 (Dr. Marcelene ButteHurrelbrink)    Medications:  Prescriptions prior to admission  Medication Sig Dispense Refill  . ALPRAZolam (XANAX) 0.5 MG tablet Take 0.5 mg by mouth at bedtime as needed for anxiety or sleep.       Marland Kitchen. asenapine (SAPHRIS) 5 MG SUBL 24 hr tablet Place 5 mg under the tongue daily.      Marland Kitchen. buPROPion (WELLBUTRIN XL) 300 MG 24 hr tablet Take 300 mg by mouth daily.      . Cholecalciferol (VITAMIN D PO) Take 1 tablet by mouth every 7 (seven) days. Sunday      .  Cyanocobalamin (VITAMIN B 12 PO) Take 1,000 mcg by mouth every other day.      . diazepam (VALIUM) 5 MG tablet Take 5 mg by mouth every 8 (eight) hours as needed for anxiety.       . dicyclomine (BENTYL) 20 MG tablet Take 20 mg by mouth 3 (three) times daily before meals.      . ferrous gluconate (FERGON) 324 MG tablet Take 324 mg by mouth 3 (three) times daily with meals.      . furosemide (LASIX) 40 MG tablet Take 40 mg by mouth daily.      Marland Kitchen. glipiZIDE (GLUCOTROL XL) 10 MG 24 hr tablet Take 10 mg by mouth daily.       Marland Kitchen. KLOR-CON M20 20 MEQ tablet Take 20 mEq by mouth daily.      Marland Kitchen. levothyroxine (SYNTHROID, LEVOTHROID) 100 MCG tablet Take 100 mcg by mouth daily before breakfast.      . levothyroxine (SYNTHROID, LEVOTHROID) 125 MCG tablet Take 125 mcg by mouth daily.      . metFORMIN (GLUCOPHAGE) 1000 MG tablet Take 1,000 mg by mouth 2 (two) times daily.      Marland Kitchen. morphine (MSIR) 15 MG tablet Take 15 mg by mouth 3 (three) times daily.      Marland Kitchen. omeprazole (PRILOSEC) 40  MG capsule Take 40 mg by mouth daily.      . pioglitazone (ACTOS) 45 MG tablet Take 22.5 mg by mouth daily.       . promethazine (PHENERGAN) 25 MG tablet Take 25 mg by mouth every 6 (six) hours as needed for nausea or vomiting.      Marland Kitchen. rOPINIRole (REQUIP) 2 MG tablet Take 2 mg by mouth 3 (three) times daily.        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  Her baseline Hg 11.4, PTLC 137 Goal of Therapy:  INR 2-3 Monitor platelets by anticoagulation protocol: Yes   Plan:  Coumadin 7.5 mg po today Daily PT/INR Monitor for bleeding F/u coumadin education  Tija Biss Poteet 01/30/2014,11:59 AM

## 2014-01-30 NOTE — Progress Notes (Signed)
Utilization review completed.  

## 2014-01-30 NOTE — Anesthesia Postprocedure Evaluation (Signed)
Anesthesia Post Note  Patient: Victoria Kaiser  Procedure(s) Performed: Procedure(s) (LRB): TOTAL KNEE ARTHROPLASTY (Left)  Anesthesia type: General  Patient location: PACU  Post pain: Pain level controlled  Post assessment: Patient's Cardiovascular Status Stable  Last Vitals:  Filed Vitals:   01/30/14 1140  BP: 140/71  Pulse: 101  Temp: 37 C  Resp: 16    Post vital signs: Reviewed and stable  Level of consciousness: alert  Complications: No apparent anesthesia complications

## 2014-01-30 NOTE — Evaluation (Signed)
Physical Therapy Evaluation Patient Details Name: Victoria Kaiser MRN: [id number] DOB: 09/22/1955 Today's Date: 01/30/2014   History of Present Illness  pt admitted for L tKA  Clinical Impression  Pt pleasant and moving well for POD #0. Pt educated for restrictions, precautions, KI use, CPM use, HEP with handout and plan for progression. Pt will benefit from acute therapy to maximize ROM, strength, gait and transfers to increase independence and decrease burden of care.     Follow Up Recommendations Home health PT    Equipment Recommendations  Rolling walker with 5" wheels;3in1 (PT)    Recommendations for Other Services       Precautions / Restrictions Precautions Precautions: Knee      Mobility  Bed Mobility Overal bed mobility: Needs Assistance Bed Mobility: Supine to Sit     Supine to sit: Min assist     General bed mobility comments: cues for use of RLE under LLE to assist with pivot to EOB  Transfers Overall transfer level: Needs assistance   Transfers: Sit to/from Stand Sit to Stand: Min guard         General transfer comment: cues for sequence, hand placement and LLE placement x 2 trials  Ambulation/Gait Ambulation/Gait assistance: Min guard Ambulation Distance (Feet): 15 Feet (x 2 trials with seated rest to void) Assistive device: Rolling walker (2 wheeled) Gait Pattern/deviations: Step-to pattern;Decreased stance time - left   Gait velocity interpretation: Below normal speed for age/gender General Gait Details: cues for sequence  Stairs            Wheelchair Mobility    Modified Rankin (Stroke Patients Only)       Balance                                             Pertinent Vitals/Pain Pt premedicated, 6/10 LLE in bed, 4/10 LLE in chair after activity     Home Living Family/patient expects to be discharged to:: Private residence Living Arrangements: Spouse/significant other Available Help at Discharge:  Family;Available 24 hours/day Type of Home: House Home Access: Stairs to enter Entrance Stairs-Rails: None Entrance Stairs-Number of Steps: 3 Home Layout: One level Home Equipment: None      Prior Function Level of Independence: Independent               Hand Dominance        Extremity/Trunk Assessment   Upper Extremity Assessment: Overall WFL for tasks assessed           Lower Extremity Assessment: LLE deficits/detail   LLE Deficits / Details: decreased strength and ROM as expected post op  Cervical / Trunk Assessment: Normal  Communication   Communication: No difficulties  Cognition Arousal/Alertness: Awake/alert Behavior During Therapy: WFL for tasks assessed/performed Overall Cognitive Status: Within Functional Limits for tasks assessed                      General Comments      Exercises Total Joint Exercises Ankle Circles/Pumps: AROM;Left;5 reps;Supine Quad Sets: AROM;Left;5 reps;Supine Heel Slides: AAROM;Left;5 reps;Supine      Assessment/Plan    PT Assessment Patient needs continued PT services  PT Diagnosis Difficulty walking;Acute pain   PT Problem List Decreased strength;Decreased range of motion;Decreased activity tolerance;Decreased mobility;Pain;Decreased knowledge of use of DME;Decreased knowledge of precautions  PT Treatment Interventions DME instruction;Gait training;Stair training;Functional mobility training;Therapeutic activities;Therapeutic  exercise;Patient/family education   PT Goals (Current goals can be found in the Care Plan section) Acute Rehab PT Goals Patient Stated Goal: be able to do household stuff without pain PT Goal Formulation: With patient/family Time For Goal Achievement: 02/06/14 Potential to Achieve Goals: Good    Frequency 7X/week   Barriers to discharge        Co-evaluation               End of Session Equipment Utilized During Treatment: Gait belt;Left knee immobilizer Activity  Tolerance: Patient tolerated treatment well Patient left: in chair;with call bell/phone within reach;with family/visitor present Nurse Communication: Weight bearing status;Mobility status         Time: [id number] PT Time Calculation (min): 30 min   Charges:   PT Evaluation $Initial PT Evaluation Tier I: 1 Procedure PT Treatments $Gait Training: 8-22 mins $Therapeutic Activity: 8-22 mins   PT G Codes:          Victoria Kaiser, Victoria Kaiser 01/30/2014, 2:23 PM Victoria MeigsMaija Tabor Eirene Kaiser, PT [phone]

## 2014-01-30 NOTE — Anesthesia Preprocedure Evaluation (Addendum)
Anesthesia Evaluation  Patient identified by MRN, date of birth, ID band Patient awake    Reviewed: Allergy & Precautions, H&P , NPO status , Patient's Chart, lab work & pertinent test results, reviewed documented beta blocker date and time   History of Anesthesia Complications (+) PONV and history of anesthetic complications  Airway Mallampati: II TM Distance: >3 FB Neck ROM: full    Dental   Pulmonary shortness of breath and with exertion, former smoker,  breath sounds clear to auscultation        Cardiovascular hypertension, Pt. on medications + Valvular Problems/Murmurs Rhythm:regular     Neuro/Psych PSYCHIATRIC DISORDERS Bipolar Disorder  Neuromuscular disease negative neurological ROS  negative psych ROS   GI/Hepatic GERD-  Medicated and Controlled,(+) Hepatitis -, Unspecified  Endo/Other  diabetes, Oral Hypoglycemic AgentsMorbid obesity  Renal/GU negative Renal ROS  negative genitourinary   Musculoskeletal   Abdominal   Peds  Hematology  (+) anemia ,   Anesthesia Other Findings See surgeon's H&P   Reproductive/Obstetrics negative OB ROS                          Anesthesia Physical Anesthesia Plan  ASA: III  Anesthesia Plan: General   Post-op Pain Management:    Induction: Intravenous  Airway Management Planned: Oral ETT and Video Laryngoscope Planned  Additional Equipment:   Intra-op Plan:   Post-operative Plan: Extubation in OR  Informed Consent: I have reviewed the patients History and Physical, chart, labs and discussed the procedure including the risks, benefits and alternatives for the proposed anesthesia with the patient or authorized representative who has indicated his/her understanding and acceptance.   Dental Advisory Given  Plan Discussed with: CRNA and Surgeon  Anesthesia Plan Comments: (Patient requests GA and no "nerve block". CF)       Anesthesia Quick  Evaluation

## 2014-01-30 NOTE — Transfer of Care (Signed)
Immediate Anesthesia Transfer of Care Note  Patient: Victoria Kaiser  Procedure(s) Performed: Procedure(s): TOTAL KNEE ARTHROPLASTY (Left)  Patient Location: PACU  Anesthesia Type:General  Level of Consciousness: sedated and responds to stimulation  Airway & Oxygen Therapy: Patient Spontanous Breathing and Patient connected to nasal cannula oxygen  Post-op Assessment: Report given to PACU RN and Post -op Vital signs reviewed and stable  Post vital signs: Reviewed and stable  Complications: No apparent anesthesia complications

## 2014-01-30 NOTE — Progress Notes (Addendum)
Notified Dr.Dalldorf of patient falling last night and hurt right ankle.  Patient states she can walk on it but it is painful.  Patient spoke to Dr. Jerl Santosalldorf and he felt it was okay to proceed with planned surgery.   Attempted to remove rings with U/S gel but was unsuccessful.  Notified Dr. Sampson GoonFitzgerald and he asked we notify Anesth. For patient.

## 2014-01-30 NOTE — Interval H&P Note (Signed)
History and Physical Interval Note:  01/30/2014 7:20 AM  Victoria Kaiser M Chasen  has presented today for surgery, with the diagnosis of LEFT KNEE DEGENERATIVE JOINT DISEASE  The various methods of treatment have been discussed with the patient and family. After consideration of risks, benefits and other options for treatment, the patient has consented to  Procedure(s): TOTAL KNEE ARTHROPLASTY (Left) as a surgical intervention .  The patient's history has been reviewed, patient examined, no change in status, stable for surgery.  I have reviewed the patient's chart and labs.  Questions were answered to the patient's satisfaction.     Dilraj Killgore G

## 2014-01-30 NOTE — Op Note (Signed)
PREOP DIAGNOSIS: DJD LEFT KNEE POSTOP DIAGNOSIS:  same PROCEDURE: LEFT TKR ANESTHESIA: General ATTENDING SURGEON: Millicent Blazejewski G ASSISTANElodia Florence: Andrew Nida PA  INDICATIONS FOR PROCEDURE: Victoria Kaiser is a 58 y.o. female who has struggled for a long time with pain due to degenerative arthritis of the left knee.  The patient has failed many conservative non-operative measures and at this point has pain which limits the ability to sleep and walk.  The patient is offered total knee replacement.  Informed operative consent was obtained after discussion of possible risks of anesthesia, infection, neurovascular injury, DVT, and death.  The importance of the post-operative rehabilitation protocol to optimize result was stressed extensively with the patient.  SUMMARY OF FINDINGS AND PROCEDURE:  Victoria Kaiser was taken to the operative suite where under the above anesthesia a left knee replacement was performed.  There were advanced degenerative changes and the bone quality was good.  We used the DePuy LCS system and placed size standard femur, 3 MBT tibia, 32 mm all polyethylene patella, and a size 10 mm spacer.  Elodia FlorenceAndrew Nida PA-C assisted throughout and was invaluable to the completion of the case in that he helped retract and maintain exposure while I placed the components.  He also helped close thereby minimizing OR time.  The patient was admitted for appropriate post-op care to include perioperative antibiotics and mechanical and pharmacologic measures for DVT prophylaxis.  DESCRIPTION OF PROCEDURE:  Victoria Kaiser was taken to the operative suite where the above anesthesia was applied.  The patient was positioned supine and prepped and draped in normal sterile fashion.  An appropriate time out was performed.  After the administration of Kefzol pre-op antibiotic the leg was elevated and exsanguinated and a tourniquet inflated.  A standard longitudinal incision was made on the anterior knee.  Dissection was  carried down to the extensor mechanism.  All appropriate anti-infective measures were used including the pre-operative antibiotic, betadine impregnated drape, and closed hooded exhaust systems for each member of the surgical team.  A medial parapatellar incision was made in the extensor mechanism and the knee cap flipped and the knee flexed.  Some residual meniscal tissues were removed along with any remaining ACL/PCL tissue.  A guide was placed on the tibia and a flat cut was made on it's superior surface.  An intramedullary guide was placed in the femur and was utilized to make anterior and posterior cuts creating an appropriate flexion gap.  A second intramedullary guide was placed in the femur to make a distal cut properly balancing the knee with an extension gap equal to the flexion gap.  The three bones sized to the above mentioned sizes and the appropriate guides were placed and utilized.  A trial reduction was done and the knee easily came to full extension and the patella tracked well on flexion.  The trial components were removed and all bones were cleaned with pulsatile lavage and then dried thoroughly.  Cement was mixed and was pressurized onto the bones followed by placement of the aforementioned components.  Excess cement was trimmed and pressure was held on the components until the cement had hardened.  The tourniquet was deflated and a small amount of bleeding was controlled with cautery and pressure.  The knee was irrigated thoroughly.  The extensor mechanism was re-approximated with V-loc suture in running fashion.  The knee was flexed and the repair was solid.  The subcutaneous tissues were re-approximated with #0 and #2-0 vicryl and the skin closed  with a subcuticular stitch and steristrips.  A sterile dressing was applied.  Intraoperative fluids, EBL, and tourniquet time can be obtained from anesthesia records.  DISPOSITION:  The patient was taken to recovery room in stable condition and  admitted for appropriate post-op care to include peri-operative antibiotic and DVT prophylaxis with mechanical and pharmacologic measures.  Glenys Snader G 01/30/2014, 9:01 AM

## 2014-01-30 NOTE — Progress Notes (Signed)
Orthopedic Tech Progress Note Patient Details:  Victoria BunnellRose M Kaiser 06/06/1956 [id number]  CPM Left Knee CPM Left Knee: On Left Knee Flexion (Degrees): 60 Left Knee Extension (Degrees): 0 Additional Comments: Trapeze bar and foot roll   Shawnie PonsCammer, Anik Wesch Carol 01/30/2014, 11:15 AM

## 2014-01-31 ENCOUNTER — Encounter (HOSPITAL_COMMUNITY): Payer: Self-pay | Admitting: Orthopaedic Surgery

## 2014-01-31 LAB — PROTIME-INR
INR: 1.48 (ref 0.00–1.49)
Prothrombin Time: 17.9 seconds — ABNORMAL HIGH (ref 11.6–15.2)

## 2014-01-31 LAB — BASIC METABOLIC PANEL
ANION GAP: 16 — AB (ref 5–15)
BUN: 9 mg/dL (ref 6–23)
CALCIUM: 8.7 mg/dL (ref 8.4–10.5)
CHLORIDE: 101 meq/L (ref 96–112)
CO2: 26 meq/L (ref 19–32)
CREATININE: 0.86 mg/dL (ref 0.50–1.10)
GFR calc Af Amer: 85 mL/min — ABNORMAL LOW (ref >90)
GFR calc non Af Amer: 74 mL/min — ABNORMAL LOW (ref >90)
GLUCOSE: 153 mg/dL — AB (ref 70–99)
Potassium: 3.8 mEq/L (ref 3.7–5.3)
Sodium: 143 mEq/L (ref 137–147)

## 2014-01-31 LAB — GLUCOSE, CAPILLARY
GLUCOSE-CAPILLARY: 89 mg/dL (ref 70–99)
Glucose-Capillary: 130 mg/dL — ABNORMAL HIGH (ref 70–99)
Glucose-Capillary: 139 mg/dL — ABNORMAL HIGH (ref 70–99)
Glucose-Capillary: 113 mg/dL — ABNORMAL HIGH (ref 70–99)

## 2014-01-31 LAB — CBC
HCT: 30.5 % — ABNORMAL LOW (ref 36.0–46.0)
Hemoglobin: 9.4 g/dL — ABNORMAL LOW (ref 12.0–15.0)
MCH: 26.7 pg (ref 26.0–34.0)
MCHC: 30.8 g/dL (ref 30.0–36.0)
MCV: 86.6 fL (ref 78.0–100.0)
PLATELETS: 124 10*3/uL — AB (ref 150–400)
RBC: 3.52 MIL/uL — AB (ref 3.87–5.11)
RDW: 15.7 % — ABNORMAL HIGH (ref 11.5–15.5)
WBC: 8.5 10*3/uL (ref 4.0–10.5)

## 2014-01-31 MED ORDER — MORPHINE SULFATE ER 15 MG PO TBCR
15.0000 mg | EXTENDED_RELEASE_TABLET | Freq: Two times a day (BID) | ORAL | Status: DC
  Administered 2014-01-31 – 2014-02-01 (×3): 30 mg via ORAL
  Administered 2014-02-02 (×2): 15 mg via ORAL
  Filled 2014-01-31: qty 2
  Filled 2014-01-31: qty 1
  Filled 2014-01-31: qty 2
  Filled 2014-01-31: qty 1
  Filled 2014-01-31: qty 2

## 2014-01-31 MED ORDER — OXYCODONE HCL 5 MG PO TABS
10.0000 mg | ORAL_TABLET | ORAL | Status: DC | PRN
  Administered 2014-01-31: 10 mg via ORAL
  Filled 2014-01-31: qty 2

## 2014-01-31 MED ORDER — WARFARIN SODIUM 2 MG PO TABS
2.0000 mg | ORAL_TABLET | Freq: Once | ORAL | Status: AC
  Administered 2014-01-31: 2 mg via ORAL
  Filled 2014-01-31: qty 1

## 2014-01-31 MED ORDER — HYDROMORPHONE HCL PF 1 MG/ML IJ SOLN
0.5000 mg | INTRAMUSCULAR | Status: DC | PRN
  Administered 2014-01-31 – 2014-02-02 (×8): 1 mg via INTRAVENOUS
  Filled 2014-01-31 (×8): qty 1

## 2014-01-31 NOTE — Progress Notes (Addendum)
Subjective: 1 Day Post-Op Procedure(s) (LRB): TOTAL KNEE ARTHROPLASTY (Left)  Activity level:  wbat Diet tolerance:  Eating well Voiding:  ok Patient reports pain as moderate and severe.    Objective: Vital signs in last 24 hours: Temp:  [98 F (36.7 C)-98.6 F (37 C)] 98 F (36.7 C) (07/08 0320) Pulse Rate:  [98-115] 106 (07/08 0320) Resp:  [8-32] 20 (07/08 0400) BP: (110-149)/(55-71) 118/55 mmHg (07/08 0320) SpO2:  [92 %-100 %] 96 % (07/08 0400)  Labs:  Recent Labs  01/30/14 1255  HGB 10.2*    Recent Labs  01/30/14 1255  WBC 7.8  RBC 3.85*  HCT 33.1*  PLT 120*    Recent Labs  01/30/14 1255  CREATININE 0.88   No results found for this basename: LABPT, INR,  in the last 72 hours  Physical Exam:  Neurologically intact ABD soft Neurovascular intact Sensation intact distally Intact pulses distally Dorsiflexion/Plantar flexion intact Incision: dressing C/D/I No cellulitis present Compartment soft  Assessment/Plan:  1 Day Post-Op Procedure(s) (LRB): TOTAL KNEE ARTHROPLASTY (Left) Advance diet Up with therapy D/C IV fluids Plan for discharge tomorrow Discharge home with home health We are stopping oxycodone and switching patient to extended release morphine along with the immediate release that she has from home. We are also going to switch her IV Dilaudid to Q2hrs instead of Q3hrs. Continue on coumadin x 2 weeks for DVT prevention Follow up in office 2 weeks post op.  Dressing changed to mepilex.    Victoria Kaiser, Ginger OrganNDREW PAUL 01/31/2014, 8:03 AM

## 2014-01-31 NOTE — Discharge Instructions (Signed)
Information on my medicine - Coumadin®   (Warfarin) ° °This medication education was reviewed with me or my healthcare representative as part of my discharge preparation.  The pharmacist that spoke with me during my hospital stay was:  Uno Esau Poteet, RPH ° °Why was Coumadin prescribed for you? °Coumadin was prescribed for you because you have a blood clot or a medical condition that can cause an increased risk of forming blood clots. Blood clots can cause serious health problems by blocking the flow of blood to the heart, lung, or brain. Coumadin can prevent harmful blood clots from forming. °As a reminder your indication for Coumadin is:   Blood Clot Prevention After Orthopedic Surgery ° °What test will check on my response to Coumadin? °While on Coumadin (warfarin) you will need to have an INR test regularly to ensure that your dose is keeping you in the desired range. The INR (international normalized ratio) number is calculated from the result of the laboratory test called prothrombin time (PT). ° °If an INR APPOINTMENT HAS NOT ALREADY BEEN MADE FOR YOU please schedule an appointment to have this lab work done by your health care provider within 7 days. °Your INR goal is usually a number between:  2 to 3 or your provider may give you a more narrow range like 2-2.5.  Ask your health care provider during an office visit what your goal INR is. ° °What  do you need to  know  About  COUMADIN? °Take Coumadin (warfarin) exactly as prescribed by your healthcare provider about the same time each day.  DO NOT stop taking without talking to the doctor who prescribed the medication.  Stopping without other blood clot prevention medication to take the place of Coumadin may increase your risk of developing a new clot or stroke.  Get refills before you run out. ° °What do you do if you miss a dose? °If you miss a dose, take it as soon as you remember on the same day then continue your regularly scheduled regimen the next  day.  Do not take two doses of Coumadin at the same time. ° °Important Safety Information °A possible side effect of Coumadin (Warfarin) is an increased risk of bleeding. You should call your healthcare provider right away if you experience any of the following: °  Bleeding from an injury or your nose that does not stop. °  Unusual colored urine (red or dark brown) or unusual colored stools (red or black). °  Unusual bruising for unknown reasons. °  A serious fall or if you hit your head (even if there is no bleeding). ° °Some foods or medicines interact with Coumadin® (warfarin) and might alter your response to warfarin. To help avoid this: °  Eat a balanced diet, maintaining a consistent amount of Vitamin K. °  Notify your provider about major diet changes you plan to make. °  Avoid alcohol or limit your intake to 1 drink for women and 2 drinks for men per day. °(1 drink is 5 oz. wine, 12 oz. beer, or 1.5 oz. liquor.) ° °Make sure that ANY health care provider who prescribes medication for you knows that you are taking Coumadin (warfarin).  Also make sure the healthcare provider who is monitoring your Coumadin knows when you have started a new medication including herbals and non-prescription products. ° °Coumadin® (Warfarin)  Major Drug Interactions  °Increased Warfarin Effect Decreased Warfarin Effect  °Alcohol (large quantities) °Antibiotics (esp. Septra/Bactrim, Flagyl, Cipro) °Amiodarone (Cordarone) °Aspirin (  ASA) °Cimetidine (Tagamet) °Megestrol (Megace) °NSAIDs (ibuprofen, naproxen, etc.) °Piroxicam (Feldene) °Propafenone (Rythmol SR) °Propranolol (Inderal) °Isoniazid (INH) °Posaconazole (Noxafil) Barbiturates (Phenobarbital) °Carbamazepine (Tegretol) °Chlordiazepoxide (Librium) °Cholestyramine (Questran) °Griseofulvin °Oral Contraceptives °Rifampin °Sucralfate (Carafate) °Vitamin K  ° °Coumadin® (Warfarin) Major Herbal Interactions  °Increased Warfarin Effect Decreased Warfarin Effect   °Garlic °Ginseng °Ginkgo biloba Coenzyme Q10 °Green tea °St. John’s wort   ° °Coumadin® (Warfarin) FOOD Interactions  °Eat a consistent number of servings per week of foods HIGH in Vitamin K °(1 serving = ½ cup)  °Collards (cooked, or boiled & drained) °Kale (cooked, or boiled & drained) °Mustard greens (cooked, or boiled & drained) °Parsley *serving size only = ¼ cup °Spinach (cooked, or boiled & drained) °Swiss chard (cooked, or boiled & drained) °Turnip greens (cooked, or boiled & drained)  °Eat a consistent number of servings per week of foods MEDIUM-HIGH in Vitamin K °(1 serving = 1 cup)  °Asparagus (cooked, or boiled & drained) °Broccoli (cooked, boiled & drained, or raw & chopped) °Brussel sprouts (cooked, or boiled & drained) *serving size only = ½ cup °Lettuce, raw (green leaf, endive, romaine) °Spinach, raw °Turnip greens, raw & chopped  ° °These websites have more information on Coumadin (warfarin):  www.coumadin.com; °www.ahrq.gov/consumer/coumadin.htm; ° ° ° °

## 2014-01-31 NOTE — Evaluation (Signed)
Occupational Therapy Evaluation Patient Details Name: Victoria Kaiser MRN: [id number] DOB: 05/17/1956 Today's Date: 01/31/2014    History of Present Illness pt admitted for L tKA   Clinical Impression   Pt admitted with the above diagnoses and presents with below problem list. Pt will benefit from continued acute OT to address the below listed deficits and maximize independence with basic ADLs prior to d/c home. PTA pt was independent with ADLs. Pt is currently needing min to mod A for ADLs and stand pivot transfers. Pt limited significantly this session by pain. Pt's spouse plans to provide 24 hour supervision/assistance at d/c.      Follow Up Recommendations  Supervision/Assistance - 24 hour;No OT follow up    Equipment Recommendations  3 in 1 bedside comode    Recommendations for Other Services       Precautions / Restrictions Precautions Precautions: Knee Precaution Comments: reviewed; educated on not placing anything, including ice pack, under knee Required Braces or Orthoses: Knee Immobilizer - Left Restrictions Weight Bearing Restrictions: Yes LLE Weight Bearing: Weight bearing as tolerated      Mobility Bed Mobility Overal bed mobility: Needs Assistance Bed Mobility: Sit to Supine;Supine to Sit     Supine to sit: Min assist;HOB elevated Sit to supine: Min assist;HOB elevated   General bed mobility comments: Physical assist to LLE during EOB<>supine with pt unable to lift LLE without assistance. Extra time, HOB elevated and bedrails also used during bed mobility. Limited by pain.  Transfers          General transfer comment: EOB only due to pain.    Balance Overall balance assessment: Needs assistance Sitting-balance support: Bilateral upper extremity supported;Feet supported Sitting balance-Leahy Scale: Poor Sitting balance - Comments: Pt supporting self with hands on bed and rails during 1 minute EOB.                                ADL  Overall ADL's : Needs assistance/impaired Eating/Feeding: Set up;Sitting   Grooming: Set up;Sitting   Upper Body Bathing: Minimal assitance;Sitting Upper Body Bathing Details (indicate cue type and reason): poor sitting balance with BUE support this session Lower Body Bathing: Moderate assistance;Sit to/from stand;With adaptive equipment   Upper Body Dressing : Minimal assistance;Sitting   Lower Body Dressing: Moderate assistance;Sit to/from stand;With adaptive equipment   Toilet Transfer: Minimal assistance;Stand-pivot;BSC;RW   Toileting- Clothing Manipulation and Hygiene: Sit to/from stand;Moderate assistance   Tub/ Shower Transfer: Moderate assistance;Stand-pivot;3 in 1;Rolling walker     General ADL Comments: Pt significantly limited by pain this session. Poor sitting balance with BUE support this session impacting level of assistance needed for UB ADLs. Educated pt and spouse on techniques and equipment for safe completion of ADLs with knee precautions.      Vision                     Perception     Praxis      Pertinent Vitals/Pain 7/10 pain at start of session. Pt premedicated.     Hand Dominance     Extremity/Trunk Assessment Upper Extremity Assessment Upper Extremity Assessment: Generalized weakness   Lower Extremity Assessment Lower Extremity Assessment: Defer to PT evaluation       Communication Communication Communication: No difficulties   Cognition Arousal/Alertness: Awake/alert Behavior During Therapy: WFL for tasks assessed/performed Overall Cognitive Status: Within Functional Limits for tasks assessed  General Comments       Exercises      Shoulder Instructions      Home Living Family/patient expects to be discharged to:: Private residence Living Arrangements: Spouse/significant other Available Help at Discharge: Family;Available 24 hours/day Type of Home: House Home Access: Stairs to enter ITT IndustriesEntrance  Stairs-Number of Steps: 3 Entrance Stairs-Rails: None Home Layout: One level     Bathroom Shower/Tub: Chief Strategy OfficerTub/shower unit   Bathroom Toilet: Standard Bathroom Accessibility: Yes How Accessible: Accessible via walker Home Equipment: None;Adaptive equipment Adaptive Equipment: Reacher        Prior Functioning/Environment Level of Independence: Independent             OT Diagnosis: Generalized weakness;Acute pain   OT Problem List: Decreased strength;Decreased activity tolerance;Impaired balance (sitting and/or standing);Decreased safety awareness;Decreased knowledge of use of DME or AE;Decreased knowledge of precautions;Obesity;Pain   OT Treatment/Interventions: Self-care/ADL training;Therapeutic exercise;DME and/or AE instruction;Therapeutic activities;Patient/family education;Balance training    OT Goals(Current goals can be found in the care plan section) Acute Rehab OT Goals Patient Stated Goal: not stated OT Goal Formulation: With patient/family Time For Goal Achievement: 02/07/14 Potential to Achieve Goals: Good ADL Goals Pt Will Perform Lower Body Bathing: with supervision;with adaptive equipment;sit to/from stand Pt Will Perform Lower Body Dressing: with supervision;with adaptive equipment;sit to/from stand Pt Will Transfer to Toilet: with supervision;ambulating (3n1 over toilet) Pt Will Perform Toileting - Clothing Manipulation and hygiene: with supervision;sit to/from stand Pt Will Perform Tub/Shower Transfer: with supervision;ambulating;3 in 1;rolling walker Additional ADL Goal #1: Pt will complete sit<>supine at supervision level to prepare for OOB ADLs.  OT Frequency: Min 3X/week   Barriers to D/C:            Co-evaluation              End of Session Equipment Utilized During Treatment: Left knee immobilizer;Oxygen CPM Left Knee CPM Left Knee: Off Nurse Communication: Other (comment) (reported pain level)  Activity Tolerance: Patient limited by  pain;Other (comment) (Pt reported feeling "very weak.") Patient left: in bed;with call bell/phone within reach;with family/visitor present   Time: 1021-1040 OT Time Calculation (min): 19 min Charges:  OT General Charges $OT Visit: 1 Procedure OT Evaluation $Initial OT Evaluation Tier I: 1 Procedure OT Treatments $Self Care/Home Management : 8-22 mins G-Codes:    Pilar GrammesMathews, Walda Hertzog H 01/31/2014, 11:01 AM

## 2014-01-31 NOTE — Progress Notes (Signed)
ANTICOAGULATION CONSULT NOTE   Pharmacy Consult for coumadin Indication: VTE prophylaxis  Allergies  Allergen Reactions  . Aspirin Rash    Patient Measurements:    Dosing Weight: 96.8 kg  Vital Signs: Temp: 98 F (36.7 C) (07/08 0320) Temp src: Oral (07/08 0320) BP: 118/55 mmHg (07/08 0320) Pulse Rate: 106 (07/08 0320)  Labs:  Recent Labs  01/30/14 1255 01/31/14 0900  HGB 10.2* 9.4*  HCT 33.1* 30.5*  PLT 120* 124*  LABPROT  --  17.9*  INR  --  1.48  CREATININE 0.88 0.86    The CrCl is unknown because both a height and weight (above a minimum accepted value) are required for this calculation.   Medical History: Past Medical History  Diagnosis Date  . Restless leg syndrome   . Obesity   . Metabolic syndrome   . Hypertension   . Diabetes   . Dyslipidemia   . Bipolar disorder   . GERD (gastroesophageal reflux disease)   . Chronic low back pain   . Chronic anemia   . Vitamin D deficiency   . Splenomegaly   . 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 Suszanne FinchHurrelbrink[place])  . Diabetic peripheral neuropathy     Probable  . IBS (irritable bowel syndrome)   . Complication of anesthesia   . PONV (postoperative nausea and vomiting)   . Nausea and/or vomiting     NO DEFINITIVE ANSWER--HAD TESTS RUN--NOTHING SHOWS UP  . Liver cirrhosis secondary to NASH (nonalcoholic steatohepatitis)     Grade 3 stage 4 on biopsy 08/24/12 (Dr. Marcelene ButteHurrelbrink)    Medications:  Prescriptions prior to admission  Medication Sig Dispense Refill  . ALPRAZolam (XANAX) 0.5 MG tablet Take 0.5 mg by mouth at bedtime as needed for anxiety or sleep.       Marland Kitchen. asenapine (SAPHRIS) 5 MG SUBL 24 hr tablet Place 5 mg under the tongue daily.      Marland Kitchen. buPROPion (WELLBUTRIN XL) 300 MG 24 hr tablet Take 300 mg by mouth daily.      . Cholecalciferol (VITAMIN D PO) Take 1 tablet by mouth every 7 (seven) days. Sunday      . Cyanocobalamin  (VITAMIN B 12 PO) Take 1,000 mcg by mouth every other day.      . diazepam (VALIUM) 5 MG tablet Take 5 mg by mouth every 8 (eight) hours as needed for anxiety.       . dicyclomine (BENTYL) 20 MG tablet Take 20 mg by mouth 3 (three) times daily before meals.      . ferrous gluconate (FERGON) 324 MG tablet Take 324 mg by mouth 3 (three) times daily with meals.      . furosemide (LASIX) 40 MG tablet Take 40 mg by mouth daily.      Marland Kitchen. glipiZIDE (GLUCOTROL XL) 10 MG 24 hr tablet Take 10 mg by mouth daily.       Marland Kitchen. KLOR-CON M20 20 MEQ tablet Take 20 mEq by mouth daily.      Marland Kitchen. levothyroxine (SYNTHROID, LEVOTHROID) 100 MCG tablet Take 100 mcg by mouth daily before breakfast.      . levothyroxine (SYNTHROID, LEVOTHROID) 125 MCG tablet Take 125 mcg by mouth daily.      . metFORMIN (GLUCOPHAGE) 1000 MG tablet Take 1,000 mg by mouth 2 (two) times daily.      Marland Kitchen. morphine (MSIR) 15 MG tablet Take 15 mg by mouth  3 (three) times daily.      Marland Kitchen. omeprazole (PRILOSEC) 40 MG capsule Take 40 mg by mouth daily.      . pioglitazone (ACTOS) 45 MG tablet Take 22.5 mg by mouth daily.       . promethazine (PHENERGAN) 25 MG tablet Take 25 mg by mouth every 6 (six) hours as needed for nausea or vomiting.      Marland Kitchen. rOPINIRole (REQUIP) 2 MG tablet Take 2 mg by mouth 3 (three) times daily.        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  Her protime has increased by 3 sec after one dose of coumadin 7.5 mg.  Concerning with h/o NASH. Goal of Therapy:  INR 2-3 Monitor platelets by anticoagulation protocol: Yes   Plan:  Coumadin 2 mg po today Daily PT/INR Monitor for bleeding F/u coumadin education  Randen Kauth [place]et 01/31/2014,11:30 AM

## 2014-01-31 NOTE — Progress Notes (Signed)
Physical Therapy Treatment Patient Details Name: Victoria BunnellRose M Dunker MRN: [id number] DOB: 11/22/1955 Today's Date: 01/31/2014    History of Present Illness pt admitted for L tKA    PT Comments    Pt continues to be greatly limited with mobility due to pain. Discussed with pt rehab goals and the need to progress mobility prior to safe D/C home. Pt verbalized understanding. Patient needs to practice stairs next session, prior to D/C home. Pt hopeful to D/C home tomorrow.   Follow Up Recommendations  Home health PT     Equipment Recommendations  Rolling walker with 5" wheels;3in1 (PT)    Recommendations for Other Services       Precautions / Restrictions Precautions Precautions: Knee Precaution Comments: pt c/o chronic back pain; reinforced no pillow under knee  Required Braces or Orthoses: Knee Immobilizer - Left Restrictions Weight Bearing Restrictions: Yes LLE Weight Bearing: Weight bearing as tolerated    Mobility  Bed Mobility               General bed mobility comments: not addressed; pt up in chair and returned to chair   Transfers Overall transfer level: Needs assistance Equipment used: Rolling walker (2 wheeled) Transfers: Sit to/from Stand Sit to Stand: Supervision         General transfer comment: supervision for cues for hand placement and safety with RW  Ambulation/Gait Ambulation/Gait assistance: Min guard Ambulation Distance (Feet): 30 Feet Assistive device: Rolling walker (2 wheeled) Gait Pattern/deviations: Step-to pattern;Decreased stance time - left;Decreased step length - right;Antalgic;Narrow base of support;Trunk flexed Gait velocity: very decreased Gait velocity interpretation: Below normal speed for age/gender General Gait Details: pt continues to be limited by pain; cues for step through gt pattern and upright posture    Stairs Stairs:  (plan to address stair goal next session )          Wheelchair Mobility    Modified Rankin  (Stroke Patients Only)       Balance Overall balance assessment: Needs assistance         Standing balance support: During functional activity;Bilateral upper extremity supported Standing balance-Leahy Scale: Poor Standing balance comment: relies heavily on UE support from RW                     Cognition Arousal/Alertness: Awake/alert Behavior During Therapy: [facility] for tasks assessed/performed Overall Cognitive Status: Within Functional Limits for tasks assessed                      Exercises Total Joint Exercises Ankle Circles/Pumps: AROM;Both;10 reps;Seated Quad Sets: AROM;Strengthening;Left;10 reps;Seated Hip ABduction/ADduction: AAROM;Strengthening;10 reps;Left;Seated Con-wayLong Arc Quad:  (unable to tolerate due to pain ) Goniometric ROM: AROM knee flexion 50degrees; limited by pain     General Comments General comments (skin integrity, edema, etc.): discusssed with pt the need to progress mobility in order to ensure safe D/C home; pt verbalized rehab goals       Pertinent Vitals/Pain C/o 7/10 pain; pt premedicated by RN    Home Living                      Prior Function            PT Goals (current goals can now be found in the care plan section) Acute Rehab PT Goals Patient Stated Goal: to go home tomorrow PT Goal Formulation: With patient/family Time For Goal Achievement: 02/06/14 Potential to Achieve Goals: Good Progress towards PT goals:  Progressing toward goals    Frequency  7X/week    PT Plan Current plan remains appropriate    Co-evaluation             End of Session Equipment Utilized During Treatment: Gait belt;Left knee immobilizer Activity Tolerance: Patient limited by pain Patient left: in chair;with call bell/phone within reach     Time: 1539-1600 PT Time Calculation (min): 21 min  Charges:  $Gait Training: 8-22 mins                    G CodesDonnamarie Poag:      Lamark Schue [place], [place]  [phone] 01/31/2014, 4:30 PM

## 2014-01-31 NOTE — Progress Notes (Signed)
Physical Therapy Treatment Patient Details Name: Victoria BunnellRose M Heckart MRN: [id number] DOB: 04/27/1956 Today's Date: 01/31/2014    History of Present Illness pt admitted for L tKA    PT Comments    Pt ambulated to door and back to bed. Greatly limited in session due to pain. Unable to perform LE exercises for strengthening and ROM due to pain. Will see later this afternoon with attempts to progress towards goals when pain is tolerable.   Follow Up Recommendations  Home health PT     Equipment Recommendations  Rolling walker with 5" wheels;3in1 (PT)    Recommendations for Other Services       Precautions / Restrictions Precautions Precautions: Knee Precaution Comments: reinforced no pillow under knee Required Braces or Orthoses: Knee Immobilizer - Left Restrictions Weight Bearing Restrictions: Yes LLE Weight Bearing: Weight bearing as tolerated    Mobility  Bed Mobility Overal bed mobility: Needs Assistance Bed Mobility: Sit to Supine;Supine to Sit     Supine to sit: Min assist;HOB elevated Sit to supine: Min assist   General bed mobility comments: pt requires (A) to advance Lt LE to/off EOB; greatly llimited by pain and requires incr time; use of handrails and HOB elevated  Transfers Overall transfer level: Needs assistance Equipment used: Rolling walker (2 wheeled) Transfers: Sit to/from Stand Sit to Stand: Min guard         General transfer comment: min guard to steady while transitioning hands to RW; cues for safety; pt greatly limited by pain   Ambulation/Gait Ambulation/Gait assistance: Min assist Ambulation Distance (Feet): 16 Feet Assistive device: Rolling walker (2 wheeled) Gait Pattern/deviations: Step-to pattern;Decreased stance time - left;Decreased step length - right;Antalgic;Trunk flexed;Narrow base of support Gait velocity: very decreased Gait velocity interpretation: <1.8 ft/sec, indicative of risk for recurrent falls General Gait Details: pt greatly  limited due to pain; cues for gt sequencing and upright posture    Stairs            Wheelchair Mobility    Modified Rankin (Stroke Patients Only)       Balance Overall balance assessment: Needs assistance Sitting-balance support: Feet supported;Single extremity supported Sitting balance-Leahy Scale: Fair Sitting balance - Comments: use of UE support throughout; felt "swimmy headed" initially; subsided with sitting EOB x5 min cues for deep breathing    Standing balance support: During functional activity;Bilateral upper extremity supported Standing balance-Leahy Scale: Poor Standing balance comment: requires UE support at all times                     Cognition Arousal/Alertness: Awake/alert Behavior During Therapy: WFL for tasks assessed/performed Overall Cognitive Status: Within Functional Limits for tasks assessed                      Exercises      General Comments        Pertinent Vitals/Pain "10/10" ; also c/o chronic back pain     Home Living                      Prior Function            PT Goals (current goals can now be found in the care plan section) Acute Rehab PT Goals Patient Stated Goal: be able to do household stuff without pain PT Goal Formulation: With patient/family Time For Goal Achievement: 02/06/14 Potential to Achieve Goals: Good Progress towards PT goals: Not progressing toward goals - comment (limited exercises due  to pain )    Frequency  7X/week    PT Plan Current plan remains appropriate    Co-evaluation             End of Session Equipment Utilized During Treatment: Gait belt;Left knee immobilizer Activity Tolerance: Patient limited by pain Patient left: in bed;with call bell/phone within reach;with family/visitor present     Time: [id number] PT Time Calculation (min): 18 min  Charges:  $Gait Training: 8-22 mins                    G Codes:      Donnamarie PoagWest, Leela Vanbrocklin [place], [place]   [phone] 01/31/2014, 9:10 AM

## 2014-01-31 NOTE — Progress Notes (Signed)
Patient stated vicodin did not relieve her pain. I paged on call md and new order for pain medicine given.

## 2014-02-01 LAB — CBC
HCT: 31.1 % — ABNORMAL LOW (ref 36.0–46.0)
HEMOGLOBIN: 9.4 g/dL — AB (ref 12.0–15.0)
MCH: 26.2 pg (ref 26.0–34.0)
MCHC: 30.2 g/dL (ref 30.0–36.0)
MCV: 86.6 fL (ref 78.0–100.0)
Platelets: 125 10*3/uL — ABNORMAL LOW (ref 150–400)
RBC: 3.59 MIL/uL — ABNORMAL LOW (ref 3.87–5.11)
RDW: 16.1 % — ABNORMAL HIGH (ref 11.5–15.5)
WBC: 10.1 10*3/uL (ref 4.0–10.5)

## 2014-02-01 LAB — GLUCOSE, CAPILLARY
GLUCOSE-CAPILLARY: 130 mg/dL — AB (ref 70–99)
GLUCOSE-CAPILLARY: 59 mg/dL — AB (ref 70–99)
Glucose-Capillary: 113 mg/dL — ABNORMAL HIGH (ref 70–99)
Glucose-Capillary: 73 mg/dL (ref 70–99)
Glucose-Capillary: 103 mg/dL — ABNORMAL HIGH (ref 70–99)

## 2014-02-01 LAB — PROTIME-INR
INR: 1.83 — AB (ref 0.00–1.49)
Prothrombin Time: 21.2 seconds — ABNORMAL HIGH (ref 11.6–15.2)

## 2014-02-01 MED ORDER — WARFARIN SODIUM 1 MG PO TABS
1.0000 mg | ORAL_TABLET | Freq: Once | ORAL | Status: AC
  Administered 2014-02-01: 1 mg via ORAL
  Filled 2014-02-01: qty 1

## 2014-02-01 NOTE — Plan of Care (Signed)
Problem: Consults Goal: Diagnosis- Total Joint Replacement Outcome: Completed/Met Date Met:  02/01/14 Primary Total Knee Left

## 2014-02-01 NOTE — Progress Notes (Signed)
Subjective: 2 Days Post-Op Procedure(s) (LRB): TOTAL KNEE ARTHROPLASTY (Left)  Activity level:  wbat Diet tolerance:  ok Voiding:  ok Patient reports pain as mild and moderate.    Pain in much more controlled on current regimen.   Objective: Vital signs in last 24 hours: Temp:  [97.9 F (36.6 C)-98.4 F (36.9 C)] 97.9 F (36.6 C) (07/09 0706) Pulse Rate:  [106-110] 107 (07/09 0706) Resp:  [16-20] 16 (07/09 0706) BP: (128-137)/(53-70) 137/63 mmHg (07/09 0706) SpO2:  [94 %-97 %] 94 % (07/09 0706)  Labs:  Recent Labs  01/30/14 1255 01/31/14 0900 02/01/14 0353  HGB 10.2* 9.4* 9.4*    Recent Labs  01/31/14 0900 02/01/14 0353  WBC 8.5 10.1  RBC 3.52* 3.59*  HCT 30.5* 31.1*  PLT 124* 125*    Recent Labs  01/30/14 1255 01/31/14 0900  NA  --  143  K  --  3.8  CL  --  101  CO2  --  26  BUN  --  9  CREATININE 0.88 0.86  GLUCOSE  --  153*  CALCIUM  --  8.7    Recent Labs  01/31/14 0900 02/01/14 0353  INR 1.48 1.83*    Physical Exam:  Neurologically intact ABD soft Neurovascular intact Sensation intact distally Intact pulses distally Dorsiflexion/Plantar flexion intact Incision: dressing C/D/I No cellulitis present Compartment soft  Assessment/Plan:  2 Days Post-Op Procedure(s) (LRB): TOTAL KNEE ARTHROPLASTY (Left) Advance diet Up with therapy Plan for discharge tomorrow Discharge home with home health Continue on Coumadin for DVT prevention Follow up in office 2 weeks post op. Continue current pain regimen.     Brelan Hannen, Ginger OrganNDREW PAUL 02/01/2014, 12:23 PM

## 2014-02-01 NOTE — Progress Notes (Signed)
Occupational Therapy Treatment Patient Details Name: Victoria Kaiser MRN: [id number] DOB: May 13, 1956 Today's Date: 02/01/2014    History of present illness pt admitted for L tKA   OT comments  Pt progressing with BADLs, but pain continues to limit her.  Will practice tub transfer tomorrow.   Follow Up Recommendations  Supervision/Assistance - 24 hour;No OT follow up    Equipment Recommendations  3 in 1 bedside comode    Recommendations for Other Services      Precautions / Restrictions Precautions Precautions: Knee Required Braces or Orthoses: Knee Immobilizer - Left Restrictions Weight Bearing Restrictions: Yes LLE Weight Bearing: Weight bearing as tolerated       Mobility Bed Mobility Overal bed mobility: Needs Assistance         Sit to supine: Min assist   General bed mobility comments: assist for Lt LE  Transfers Overall transfer level: Needs assistance Equipment used: Rolling walker (2 wheeled) Transfers: Sit to/from [employer] Sit to Stand: Supervision Stand pivot transfers: Supervision            Balance Overall balance assessment: No apparent balance deficits (not formally assessed)                                 ADL                           Toilet Transfer: Min guard;Ambulation;Comfort height toilet   Toileting- Clothing Manipulation and Hygiene: Min guard;Sit to/from stand         General ADL Comments: Pt instructed in use of reacher for donning pants - she reports she recalls the correct method for doing so from previous back surgery and didn't feels she needed to actually practice.  Spouse reports he will assist her with socks as she typically crosses her ankle over knee.   Discussed method for tub transfer using 3in1.  She ambulated ~100 ft in the hallway towards the rehab gym, but pt with increased knee pain requiring her to turn back toward room.  Pt placed in CPM in the bed, but unable to tolerate,  so turned CPM off and informed RN of increased pain and possible need to adjust ROM on CPM      Vision                     Perception     Praxis      Cognition   Behavior During Therapy: [facility] for tasks assessed/performed Overall Cognitive Status: Within Functional Limits for tasks assessed                       Extremity/Trunk Assessment               Exercises     Shoulder Instructions       General Comments      Pertinent Vitals/ Pain       See vitals flow sheet.   Home Living                                          Prior Functioning/Environment              Frequency Min 3X/week     Progress Toward Goals  OT Goals(current goals can  now be found in the care plan section)  Progress towards OT goals: Progressing toward goals  ADL Goals Pt Will Perform Lower Body Bathing: with supervision;with adaptive equipment;sit to/from stand Pt Will Perform Lower Body Dressing: with supervision;with adaptive equipment;sit to/from stand Pt Will Transfer to Toilet: with supervision;ambulating Pt Will Perform Toileting - Clothing Manipulation and hygiene: with supervision;sit to/from stand Pt Will Perform Tub/Shower Transfer: with supervision;ambulating;3 in 1;rolling walker Additional ADL Goal #1: Pt will complete sit<>supine at supervision level to prepare for OOB ADLs.  Plan Discharge plan remains appropriate    Co-evaluation                 End of Session Equipment Utilized During Treatment: Rolling walker   Activity Tolerance Patient limited by pain   Patient Left in bed;with call bell/phone within reach;with family/visitor present   Nurse Communication Patient requests pain meds;Mobility status        Time: [id number] OT Time Calculation (min): 31 min  Charges: OT General Charges $OT Visit: 1 Procedure OT Treatments $Self Care/Home Management : 8-22 mins $Therapeutic Activity: 8-22 mins  Brenda Samano  M 02/01/2014, 3:50 PM

## 2014-02-01 NOTE — Progress Notes (Signed)
ANTICOAGULATION CONSULT NOTE   Pharmacy Consult for coumadin Indication: VTE prophylaxis  Allergies  Allergen Reactions  . Aspirin Rash    Patient Measurements:    Dosing Weight: 96.8 kg  Vital Signs: Temp: 97.9 F (36.6 C) (07/09 0706) Temp src: Oral (07/09 0706) BP: 137/63 mmHg (07/09 0706) Pulse Rate: 107 (07/09 0706)  Labs:  Recent Labs  01/30/14 1255 01/31/14 0900 02/01/14 0353  HGB 10.2* 9.4* 9.4*  HCT 33.1* 30.5* 31.1*  PLT 120* 124* 125*  LABPROT  --  17.9* 21.2*  INR  --  1.48 1.83*  CREATININE 0.88 0.86  --     The CrCl is unknown because both a height and weight (above a minimum accepted value) are required for this calculation.   Medical History: Past Medical History  Diagnosis Date  . Restless leg syndrome   . Obesity   . Metabolic syndrome   . Hypertension   . Diabetes   . Dyslipidemia   . Bipolar disorder   . GERD (gastroesophageal reflux disease)   . Chronic low back pain   . Chronic anemia   . Vitamin D deficiency   . Splenomegaly   . 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 Suszanne Fin[facility]relbrink[facility])  . Diabetic peripheral neuropathy     Probable  . IBS (irritable bowel syndrome)   . Complication of anesthesia   . PONV (postoperative nausea and vomiting)   . Nausea and/or vomiting     NO DEFINITIVE ANSWER--HAD TESTS RUN--NOTHING SHOWS UP  . Liver cirrhosis secondary to NASH (nonalcoholic steatohepatitis)     Grade 3 stage 4 on biopsy 08/24/12 (Dr. Marcelene ButteHurrelbrink)    Medications:  Prescriptions prior to admission  Medication Sig Dispense Refill  . ALPRAZolam (XANAX) 0.5 MG tablet Take 0.5 mg by mouth at bedtime as needed for anxiety or sleep.       Marland Kitchen. asenapine (SAPHRIS) 5 MG SUBL 24 hr tablet Place 5 mg under the tongue daily.      Marland Kitchen. buPROPion (WELLBUTRIN XL) 300 MG 24 hr tablet Take 300 mg by mouth daily.      . Cholecalciferol (VITAMIN D PO) Take 1 tablet by mouth  every 7 (seven) days. Sunday      . Cyanocobalamin (VITAMIN B 12 PO) Take 1,000 mcg by mouth every other day.      . diazepam (VALIUM) 5 MG tablet Take 5 mg by mouth every 8 (eight) hours as needed for anxiety.       . dicyclomine (BENTYL) 20 MG tablet Take 20 mg by mouth 3 (three) times daily before meals.      . ferrous gluconate (FERGON) 324 MG tablet Take 324 mg by mouth 3 (three) times daily with meals.      . furosemide (LASIX) 40 MG tablet Take 40 mg by mouth daily.      Marland Kitchen. glipiZIDE (GLUCOTROL XL) 10 MG 24 hr tablet Take 10 mg by mouth daily.       Marland Kitchen. KLOR-CON M20 20 MEQ tablet Take 20 mEq by mouth daily.      Marland Kitchen. levothyroxine (SYNTHROID, LEVOTHROID) 100 MCG tablet Take 100 mcg by mouth daily before breakfast.      . levothyroxine (SYNTHROID, LEVOTHROID) 125 MCG tablet Take 125 mcg by mouth daily.      . metFORMIN (GLUCOPHAGE) 1000 MG tablet Take 1,000 mg by mouth 2 (two) times daily.      .Marland Kitchen  morphine (MSIR) 15 MG tablet Take 15 mg by mouth 3 (three) times daily.      Marland Kitchen omeprazole (PRILOSEC) 40 MG capsule Take 40 mg by mouth daily.      . pioglitazone (ACTOS) 45 MG tablet Take 22.5 mg by mouth daily.       . promethazine (PHENERGAN) 25 MG tablet Take 25 mg by mouth every 6 (six) hours as needed for nausea or vomiting.      Marland Kitchen rOPINIRole (REQUIP) 2 MG tablet Take 2 mg by mouth 3 (three) times daily.        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  Her INR today 1.83 after 2 doses of coumadin  Concerning with h/o NASH. Goal of Therapy:  INR 2-3 Monitor platelets by anticoagulation protocol: Yes   Plan:  Coumadin 1 mg po today Daily PT/INR Monitor for bleeding  Malakhi Markwood Poteet 02/01/2014,1:11 PM

## 2014-02-01 NOTE — Progress Notes (Signed)
Physical Therapy Treatment Patient Details Name: Victoria BunnellRose M Glad MRN: [id number] DOB: 07/04/1956 Today's Date: 02/01/2014    History of Present Illness pt admitted for L tKA    PT Comments    Pt slowly progressing mobility. Was able to ambulate 13400ft at supervision level. Continues to have antalgic gt due to pain. C/o dizziness and lightheadedness; BP 133/6/; RN checked blood sugar. Plan to address stair goal in afternoon session. Pt hopeful for D/C this afternoon if pain is controlled.   Follow Up Recommendations  Home health PT     Equipment Recommendations  Rolling walker with 5" wheels;3in1 (PT)    Recommendations for Other Services       Precautions / Restrictions Precautions Precautions: Knee Required Braces or Orthoses: Knee Immobilizer - Left Restrictions Weight Bearing Restrictions: Yes LLE Weight Bearing: Weight bearing as tolerated    Mobility  Bed Mobility Overal bed mobility: Needs Assistance Bed Mobility: Supine to Sit     Supine to sit: Supervision;HOB elevated     General bed mobility comments: pt relied on handrails; HOB elevated; incr time due to pain; pt able to hook Rt LE under Lt LE to (A) off bed   Transfers Overall transfer level: Needs assistance Equipment used: Rolling walker (2 wheeled) Transfers: Sit to/from Stand Sit to Stand: Supervision         General transfer comment: supervision for cues for hand placement and safety   Ambulation/Gait Ambulation/Gait assistance: Supervision Ambulation Distance (Feet): 100 Feet Assistive device: Rolling walker (2 wheeled) Gait Pattern/deviations: Step-to pattern;Decreased stance time - left;Decreased step length - right;Narrow base of support;Trunk flexed;Antalgic Gait velocity: decreased due to pain  Gait velocity interpretation: Below normal speed for age/gender General Gait Details: cues for gt sequencing and safety with RW; pt at supervision level; limited distance due to pain     Stairs Stairs:  (plan to attempt in PM session )          Wheelchair Mobility    Modified Rankin (Stroke Patients Only)       Balance Overall balance assessment: No apparent balance deficits (not formally assessed)             Standing balance comment: No LOB noted; does require RW for safety; Supervision (A)                     Cognition Arousal/Alertness: Awake/alert Behavior During Therapy: WFL for tasks assessed/performed Overall Cognitive Status: Within Functional Limits for tasks assessed                      Exercises Total Joint Exercises Ankle Circles/Pumps: AROM;Both;Seated;AAROM;15 reps Quad Sets: AROM;Strengthening;Left;10 reps;Seated Hip ABduction/ADduction: AAROM;Strengthening;Left;10 reps;Seated Long Arc Quad: AAROM;Left;Strengthening;10 reps;Seated Knee Flexion: AAROM;Left;5 reps;Other (comment);Seated (limited by pain; (A) with Rt LE ) Goniometric ROM: AROm in sitting 55degrees; limited by pain    General Comments        Pertinent Vitals/Pain 7/10; premedicated by RN; patient repositioned for comfort     Home Living                      Prior Function            PT Goals (current goals can now be found in the care plan section) Acute Rehab PT Goals Patient Stated Goal: to go home today  PT Goal Formulation: With patient/family Time For Goal Achievement: 02/06/14 Potential to Achieve Goals: Good Progress towards PT goals: Progressing toward goals  Frequency  7X/week    PT Plan Current plan remains appropriate    Co-evaluation             End of Session Equipment Utilized During Treatment: Gait belt;Left knee immobilizer Activity Tolerance: Patient limited by pain Patient left: in chair;with call bell/phone within reach;with family/visitor present     Time: 1041-1105 PT Time Calculation (min): 24 min  Charges:  $Gait Training: 8-22 mins $Therapeutic Exercise: 8-22 mins                     G CodesDonell Sievert, [place]  [phone] 02/01/2014, 11:19 AM

## 2014-02-01 NOTE — Progress Notes (Signed)
Physical Therapy Treatment Patient Details Name: Victoria Kaiser MRN: [id number] DOB: 08/18/1955 Today's Date: 02/01/2014    History of Present Illness pt admitted for L tKA    PT Comments    Pt insistent on ambulating with KI. Reports she ambulated without KI earlier and "felt like my knee was going to buckle". Pt continues to demo decreased quadricep control with exercises. Pt hopeful to D/C tomorrow; plan to address stair goal next session with patient's husband present.   Follow Up Recommendations  Home health PT;Supervision/Assistance - 24 hour     Equipment Recommendations  Rolling walker with 5" wheels;3in1 (PT)    Recommendations for Other Services       Precautions / Restrictions Precautions Precautions: Knee Precaution Comments: pt c/o chronic back pain; reinforced no pillow under knee  Required Braces or Orthoses: Knee Immobilizer - Left Restrictions Weight Bearing Restrictions: Yes LLE Weight Bearing: Weight bearing as tolerated    Mobility  Bed Mobility Overal bed mobility: Modified Independent Bed Mobility: Supine to Sit     Supine to sit: HOB elevated;Modified independent (Device/Increase time) Sit to supine: Modified independent (Device/Increase time)   General bed mobility comments: uses Rt LE to (A) Lt; no physical (A) needed  Transfers Overall transfer level: Needs assistance Equipment used: Rolling walker (2 wheeled) Transfers: Sit to/from Stand Sit to Stand: Supervision Stand pivot transfers: Supervision       General transfer comment: supervision for safety only  Ambulation/Gait Ambulation/Gait assistance: Supervision Ambulation Distance (Feet): 100 Feet Assistive device: Rolling walker (2 wheeled) Gait Pattern/deviations: Step-to pattern;Decreased step length - right;Decreased stance time - left;Antalgic Gait velocity: beginning to incr  Gait velocity interpretation: Below normal speed for age/gender General Gait Details: pt limited due  to pain; persists with antalgic gt even given cues for proper step through sequencing; cues for deep breathing; tends to hold breathe secondary to pain; pt insists on wearing KI; states it makes her more comfortable    Stairs            Wheelchair Mobility    Modified Rankin (Stroke Patients Only)       Balance Overall balance assessment: No apparent balance deficits (not formally assessed)                                  Cognition Arousal/Alertness: Awake/alert Behavior During Therapy: WFL for tasks assessed/performed Overall Cognitive Status: Within Functional Limits for tasks assessed                      Exercises Total Joint Exercises Ankle Circles/Pumps: AROM;Both;15 reps;Supine Quad Sets: AROM;Strengthening;Left;10 reps;Supine;Other (comment) (with use of footsie roll)    General Comments        Pertinent Vitals/Pain 7/10; pt was premedicated by RN; patient repositioned for comfort     Home Living                      Prior Function            PT Goals (current goals can now be found in the care plan section) Acute Rehab PT Goals Patient Stated Goal: home tomorrow PT Goal Formulation: With patient Time For Goal Achievement: 02/06/14 Potential to Achieve Goals: Good Progress towards PT goals: Progressing toward goals    Frequency  7X/week    PT Plan Current plan remains appropriate    Co-evaluation  End of Session Equipment Utilized During Treatment: Gait belt;Left knee immobilizer Activity Tolerance: Patient limited by pain Patient left: in bed;with call bell/phone within reach;Other (comment) (tolerating footsie roll)     Time: 1610-9604 PT Time Calculation (min): 16 min  Charges:  $Gait Training: 8-22 mins                    G Codes:      Donnamarie Poag [place], [place]  [phone] 02/01/2014, 4:34 PM

## 2014-02-01 NOTE — Progress Notes (Signed)
Hypoglycemic Event  CBG: 59  Treatment: 15 GM carbohydrate snack  Symptoms: Shaky  Follow-up CBG: Time: 1701 CBG Result: 73  Possible Reasons for Event: Unknown  Comments/MD notified: Elodia FlorenceAndrew Nida PA notified. Instructed to not give 1700 Metformin.    Leonia ReevesFanjoy, Whalen Trompeter J  Remember to initiate Hypoglycemia Order Set & complete

## 2014-02-01 NOTE — Care Management Note (Signed)
CARE MANAGEMENT NOTE 02/01/2014  Patient:  Victoria Kaiser,Victoria Kaiser   Account Number:  [id number]  Date Initiated:  02/01/2014  Documentation initiated by:  Vance PeperBRADY,Arwa Yero  Subjective/Objective Assessment:   58 yr old female s/p left total knee arthroplasty.     Action/Plan:   Patient preoperatively setup with [facility], no changes. DME has been delivered. Patient has family support at discharge.   Anticipated DC Date:  02/02/2014   Anticipated DC Plan:  HOME W HOME HEALTH SERVICES      DC Planning Services  CM consult      [facility]  DURABLE MEDICAL EQUIPMENT   Choice offered to / List presented to:  C-1 Patient   DME arranged  3-N-1  WALKER - ROLLING  CPM      DME agency  [employer]     HH arranged  HH-2 PT      HH agency  [facility]   Status of service:  Completed, signed off Medicare Important Message given?  NA - LOS <3 / Initial given by admissions (If response is "NO", the following Medicare IM given date fields will be blank) Date Medicare IM given:   Medicare IM given by:   Date Additional Medicare IM given:   Additional Medicare IM given by:    Discharge Disposition:  HOME W HOME HEALTH SERVICES  Per UR Regulation:  Reviewed for med. necessity/level of care/duration of stay

## 2014-02-02 LAB — PROTIME-INR
INR: 1.79 — ABNORMAL HIGH (ref 0.00–1.49)
Prothrombin Time: 20.8 seconds — ABNORMAL HIGH (ref 11.6–15.2)

## 2014-02-02 LAB — CBC
HEMATOCRIT: 30.4 % — AB (ref 36.0–46.0)
HEMOGLOBIN: 9.3 g/dL — AB (ref 12.0–15.0)
MCH: 26.3 pg (ref 26.0–34.0)
MCHC: 30.6 g/dL (ref 30.0–36.0)
MCV: 86.1 fL (ref 78.0–100.0)
Platelets: 131 10*3/uL — ABNORMAL LOW (ref 150–400)
RBC: 3.53 MIL/uL — ABNORMAL LOW (ref 3.87–5.11)
RDW: 16 % — ABNORMAL HIGH (ref 11.5–15.5)
WBC: 9.1 10*3/uL (ref 4.0–10.5)

## 2014-02-02 LAB — GLUCOSE, CAPILLARY
GLUCOSE-CAPILLARY: 88 mg/dL (ref 70–99)
GLUCOSE-CAPILLARY: 124 mg/dL — AB (ref 70–99)
Glucose-Capillary: 91 mg/dL (ref 70–99)

## 2014-02-02 MED ORDER — WARFARIN SODIUM 2 MG PO TABS: ORAL_TABLET | ORAL | Status: AC

## 2014-02-02 MED ORDER — METHOCARBAMOL 500 MG PO TABS: 500.0000 mg | ORAL_TABLET | Freq: Four times a day (QID) | ORAL | Status: AC | PRN

## 2014-02-02 MED ORDER — WARFARIN SODIUM 2 MG PO TABS
2.0000 mg | ORAL_TABLET | Freq: Once | ORAL | Status: DC
  Filled 2014-02-02: qty 1

## 2014-02-02 MED ORDER — MORPHINE SULFATE ER 15 MG PO TBCR: 15.0000 mg | EXTENDED_RELEASE_TABLET | Freq: Two times a day (BID) | ORAL | Status: AC

## 2014-02-02 NOTE — Progress Notes (Signed)
Physical Therapy Treatment Patient Details Name: Victoria BunnellRose M Kaiser MRN: [id number] DOB: 09/05/1955 Today's Date: 02/02/2014    History of Present Illness pt admitted for L tKA    PT Comments    Patient is progressing this session despite pain level. Patient highly motivated to work through her pain and is eager to DC home. Patient was able to complete stair training this AM and is ready for DC home based on goals set on eval and assistance available at home.   Follow Up Recommendations  Home health PT;Supervision/Assistance - 24 hour     Equipment Recommendations       Recommendations for Other Services       Precautions / Restrictions Precautions Precautions: Knee Required Braces or Orthoses: Knee Immobilizer - Left Restrictions LLE Weight Bearing: Weight bearing as tolerated    Mobility  Bed Mobility                  Transfers Overall transfer level: Needs assistance Equipment used: Rolling walker (2 wheeled) Transfers: Sit to/from Stand Sit to Stand: Supervision         General transfer comment: supervision for safety only  Ambulation/Gait Ambulation/Gait assistance: Supervision Ambulation Distance (Feet): 140 Feet Assistive device: Rolling walker (2 wheeled) Gait Pattern/deviations: Step-through pattern;Decreased stride length     General Gait Details: Patient walking with step through gait pattern. Appeared less antalgic this session. Patient cued to look forward.    Stairs Stairs: Yes Stairs assistance: Min assist Stair Management: Step to pattern;Backwards;With walker;No rails Number of Stairs: 2 General stair comments: Patient cued for seqency and technique. Min A for RW. Husband present for education.   Wheelchair Mobility    Modified Rankin (Stroke Patients Only)       Balance                                    Cognition Arousal/Alertness: Awake/alert Behavior During Therapy: WFL for tasks assessed/performed Overall  Cognitive Status: Within Functional Limits for tasks assessed                      Exercises Total Joint Exercises Short Arc Quad: AAROM;Left;10 reps Heel Slides: AAROM;Left;10 reps Straight Leg Raises: AAROM;Left;10 reps    General Comments        Pertinent Vitals/Pain 8/10 L knee pain. patient repositioned for comfort     Home Living                      Prior Function            PT Goals (current goals can now be found in the care plan section) Progress towards PT goals: Progressing toward goals    Frequency  7X/week    PT Plan Current plan remains appropriate    Co-evaluation             End of Session Equipment Utilized During Treatment: Gait belt;Left knee immobilizer Activity Tolerance: Patient limited by pain;Patient tolerated treatment well Patient left: in chair;with call bell/phone within reach;with family/visitor present     Time: 4098-11910938-1001 PT Time Calculation (min): 23 min  Charges:  $Gait Training: 8-22 mins $Therapeutic Exercise: 8-22 mins                    G Codes:      Fredrich BirksRobinette, Julia Elizabeth 02/02/2014, 10:07 AM 02/02/2014 Fredrich Birksobinette, Julia Elizabeth PTA [phone]  pager [phone] office

## 2014-02-02 NOTE — Progress Notes (Signed)
Occupational Therapy Treatment and Discharge Patient Details Name: Victoria Kaiser MRN: [id number] DOB: 11/18/1955 Today's Date: 02/02/2014    History of present illness pt admitted for L tKA   OT comments  This 58 yo female admitted and underwent above presents to acute OT with all education completed with her and her husband, acute OT will sign off.  Follow Up Recommendations  Supervision/Assistance - 24 hour;No OT follow up    Equipment Recommendations  3 in 1 bedside comode       Precautions / Restrictions Precautions Precautions: Knee Required Braces or Orthoses: Knee Immobilizer - Left Restrictions Weight Bearing Restrictions: No LLE Weight Bearing: Weight bearing as tolerated       Mobility Bed Mobility               General bed mobility comments: Pt up in recliner upon my arrival  Transfers Overall transfer level: Needs assistance Equipment used: Rolling walker (2 wheeled) Transfers: Sit to/from Stand Sit to Stand: Supervision Stand pivot transfers: Supervision       General transfer comment: supervision for safety only        ADL Overall ADL's : Needs assistance/impaired                                 Tub/ Shower Transfer: Tub transfer;Minimal assistance;Ambulation;3 in 1;Rolling walker Tub/Shower Transfer Details (indicate cue type and reason): A as she steps into the tub with her good leg and then A to help her operated leg swing up an over into tub.                    Cognition   Behavior During Therapy: WFL for tasks assessed/performed Overall Cognitive Status: Within Functional Limits for tasks assessed                         Exercises Total Joint Exercises Short Arc Quad: AAROM;Left;10 reps Heel Slides: AAROM;Left;10 reps Straight Leg Raises: AAROM;Left;10 reps           Pertinent Vitals/ Pain       8/10 FACES; pre-medicated and could have something else in 45 minutes (pt was aware)             Progress Toward Goals  OT Goals(current goals can now be found in the care plan section)  Progress towards OT goals:  (All education completed and pt/husband without further questions about BADLs)     Plan Discharge plan remains appropriate       End of Session Equipment Utilized During Treatment: Rolling walker;Gait belt   Activity Tolerance Patient limited by pain   Patient Left  (with PT in gym for stair training)           Time: 4782-95620909-0925 OT Time Calculation (min): 16 min  Charges: OT General Charges $OT Visit: 1 Procedure OT Treatments $Self Care/Home Management : 8-22 mins  Evette GeorgesLeonard, [name] [phone] 02/02/2014, 11:22 AM

## 2014-02-02 NOTE — Progress Notes (Signed)
ANTICOAGULATION CONSULT NOTE   Pharmacy Consult for coumadin Indication: VTE prophylaxis  Allergies  Allergen Reactions  . Aspirin Rash    Patient Measurements:    Dosing Weight: 96.8 kg  Vital Signs: Temp: 99.1 F (37.3 C) (07/10 0627) Temp src: Oral (07/10 0627) BP: 129/50 mmHg (07/10 0627) Pulse Rate: 98 (07/10 0627)  Labs:  Recent Labs  01/31/14 0900 02/01/14 0353 02/02/14 0647  HGB 9.4* 9.4* 9.3*  HCT 30.5* 31.1* 30.4*  PLT 124* 125* 131*  LABPROT 17.9* 21.2* 20.8*  INR 1.48 1.83* 1.79*  CREATININE 0.86  --   --     The CrCl is unknown because both a height and weight (above a minimum accepted value) are required for this calculation.  Assessment: 58 yo lady continues on coumadin for VTE px s/p TKA.  Her INR is subtherapeutic at 1.79. CBC is stable and no bleeding noted.   Goal of Therapy:  INR 2-3   Plan:  1. Coumadin 2mg  PO x 1 tonight 2. F/u AM INR  Lysle Pearlachel Galit Urich, PharmD, BCPS Pager # [phone] 02/02/2014 2:37 PM

## 2014-02-02 NOTE — Discharge Summary (Signed)
Patient ID: Victoria Kaiser MRN: [id number] DOB/AGE: 58/12/1955 58 y.o.  Admit date: 01/30/2014 Discharge date: 02/02/2014  Admission Diagnoses:  Principal Problem:   Left knee DJD Active Problems:   Diabetes   Obesity, morbid   Discharge Diagnoses:  Same  Past Medical History  Diagnosis Date  . Restless leg syndrome   . Obesity   . Metabolic syndrome   . Hypertension   . Diabetes   . Dyslipidemia   . Bipolar disorder   . GERD (gastroesophageal reflux disease)   . Chronic low back pain   . Chronic anemia   . Vitamin D deficiency   . Splenomegaly   . 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 Suszanne [facility])  . Diabetic peripheral neuropathy     Probable  . IBS (irritable bowel syndrome)   . Complication of anesthesia   . PONV (postoperative nausea and vomiting)   . Nausea and/or vomiting     NO DEFINITIVE ANSWER--HAD TESTS RUN--NOTHING SHOWS UP  . Liver cirrhosis secondary to NASH (nonalcoholic steatohepatitis)     Grade 3 stage 4 on biopsy 08/24/12 (Dr. Marcelene ButteHurrelbrink)    Surgeries: Procedure(s): TOTAL KNEE ARTHROPLASTY on 01/30/2014   Consultants:    Discharged Condition: Improved  Hospital Course: Victoria BunnellRose M Valeri is an 58 y.o. female who was admitted 01/30/2014 for operative treatment ofLeft knee DJD. Patient has severe unremitting pain that affects sleep, daily activities, and work/hobbies. After pre-op clearance the patient was taken to the operating room on 01/30/2014 and underwent  Procedure(s): TOTAL KNEE ARTHROPLASTY.    Patient was given perioperative antibiotics: Anti-infectives   Start     Dose/Rate Route Frequency Ordered Stop   01/30/14 1330  ceFAZolin (ANCEF) IVPB 2 g/50 mL premix     2 g 100 mL/hr over 30 Minutes Intravenous Every 6 hours 01/30/14 1151 01/30/14 2006   01/30/14 0600  ceFAZolin (ANCEF) IVPB 2 g/50 mL premix     2 g 100 mL/hr over 30 Minutes Intravenous On call to O.R.  01/29/14 1402 01/30/14 [id number]       Patient was given sequential compression devices, early ambulation, and chemoprophylaxis to prevent DVT.  Patient benefited maximally from hospital stay and there were no complications.    Recent vital signs: Patient Vitals for the past 24 hrs:  BP Temp Temp src Pulse Resp SpO2  02/02/14 0627 129/50 mmHg 99.1 F (37.3 C) Oral 98 18 94 %  02/02/14 0000 - - - - 16 95 %  02/01/14 2039 114/53 mmHg 99.1 F (37.3 C) Oral 105 16 95 %  02/01/14 2000 - - - - 16 98 %  02/01/14 1433 116/51 mmHg 98.3 F (36.8 C) Oral 106 16 98 %     Recent laboratory studies:  Recent Labs  01/30/14 1255 01/31/14 0900 02/01/14 0353  WBC 7.8 8.5 10.1  HGB 10.2* 9.4* 9.4*  HCT 33.1* 30.5* 31.1*  PLT 120* 124* 125*  NA  --  143  --   K  --  3.8  --   CL  --  101  --   CO2  --  26  --   BUN  --  9  --   CREATININE 0.88 0.86  --   GLUCOSE  --  153*  --   INR  --  1.48 1.83*  CALCIUM  --  8.7  --      Discharge Medications:  Medication List         ALPRAZolam 0.5 MG tablet  Commonly known as:  XANAX  Take 0.5 mg by mouth at bedtime as needed for anxiety or sleep.     buPROPion 300 MG 24 hr tablet  Commonly known as:  WELLBUTRIN XL  Take 300 mg by mouth daily.     diazepam 5 MG tablet  Commonly known as:  VALIUM  Take 5 mg by mouth every 8 (eight) hours as needed for anxiety.     dicyclomine 20 MG tablet  Commonly known as:  BENTYL  Take 20 mg by mouth 3 (three) times daily before meals.     ferrous gluconate 324 MG tablet  Commonly known as:  FERGON  Take 324 mg by mouth 3 (three) times daily with meals.     furosemide 40 MG tablet  Commonly known as:  LASIX  Take 40 mg by mouth daily.     glipiZIDE 10 MG 24 hr tablet  Commonly known as:  GLUCOTROL XL  Take 10 mg by mouth daily.     KLOR-CON M20 20 MEQ tablet  Generic drug:  potassium chloride SA  Take 20 mEq by mouth daily.     levothyroxine 125 MCG tablet  Commonly known as:   SYNTHROID, LEVOTHROID  Take 125 mcg by mouth daily.     levothyroxine 100 MCG tablet  Commonly known as:  SYNTHROID, LEVOTHROID  Take 100 mcg by mouth daily before breakfast.     metFORMIN 1000 MG tablet  Commonly known as:  GLUCOPHAGE  Take 1,000 mg by mouth 2 (two) times daily.     methocarbamol 500 MG tablet  Commonly known as:  ROBAXIN  Take 1 tablet (500 mg total) by mouth every 6 (six) hours as needed for muscle spasms.     morphine 15 MG 12 hr tablet  Commonly known as:  MS CONTIN  Take 1-2 tablets (15-30 mg total) by mouth every 12 (twelve) hours.     morphine 15 MG tablet  Commonly known as:  MSIR  Take 15 mg by mouth 3 (three) times daily.     omeprazole 40 MG capsule  Commonly known as:  PRILOSEC  Take 40 mg by mouth daily.     pioglitazone 45 MG tablet  Commonly known as:  ACTOS  Take 22.5 mg by mouth daily.     promethazine 25 MG tablet  Commonly known as:  PHENERGAN  Take 25 mg by mouth every 6 (six) hours as needed for nausea or vomiting.     rOPINIRole 2 MG tablet  Commonly known as:  REQUIP  Take 2 mg by mouth 3 (three) times daily.     SAPHRIS 5 MG Subl 24 hr tablet  Generic drug:  asenapine  Place 5 mg under the tongue daily.     VITAMIN B 12 PO  Take 1,000 mcg by mouth every other day.     VITAMIN D PO  Take 1 tablet by mouth every 7 (seven) days. Sunday     warfarin 2 MG tablet  Commonly known as:  COUMADIN  Take as directed by home health to keep INR between 2-3.        Diagnostic Studies: Dg Chest 2 View  01/18/2014   CLINICAL DATA:  Pre admit for knee replacement.  EXAM: CHEST  2 VIEW  COMPARISON:  CT of the chest 10/31/2003  FINDINGS: Heart is upper limits normal in size. The lungs are free of focal consolidations  and pleural effusions. No pulmonary edema. Spinal stimulator overlies the mid thoracic levels. Patient has had previous cervical fusion. Lumbar hardware is partially imaged.  IMPRESSION: 1.  No evidence for acute  cardiopulmonary abnormality. 2. Postoperative changes.   Electronically Signed   By: Rosalie Gums M.D.   On: 01/18/2014 09:10    Disposition:       Discharge Instructions   Call MD / Call 911    Complete by:  As directed   If you experience chest pain or shortness of breath, CALL 911 and be transported to the hospital emergency room.  If you develope a fever above 101 F, pus (white drainage) or increased drainage or redness at the wound, or calf pain, call your surgeon's office.     Constipation Prevention    Complete by:  As directed   Drink plenty of fluids.  Prune juice may be helpful.  You may use a stool softener, such as Colace (over the counter) 100 mg twice a day.  Use MiraLax (over the counter) for constipation as needed.     Diet - low sodium heart healthy    Complete by:  As directed      Increase activity slowly as tolerated    Complete by:  As directed            Follow-up Information   Follow up with Velna Ochs, MD. Call in 2 weeks.   Specialty:  Orthopedic Surgery   Contact information:   [address] [place] [address] [phone]       Follow up with [facility]. (Someone from [facility] will contact you concerning start date and time for physical therapy.)    Contact information:   [address] [place] [address] [phone]        Signed: Drema Halon 02/02/2014, 7:57 AM

## 2014-06-04 ENCOUNTER — Other Ambulatory Visit: Payer: Self-pay | Admitting: Internal Medicine

## 2014-06-04 DIAGNOSIS — N6001 Solitary cyst of right breast: Secondary | ICD-10-CM

## 2014-06-12 ENCOUNTER — Other Ambulatory Visit: Payer: Medicare Other

## 2014-07-05 ENCOUNTER — Ambulatory Visit
Admission: RE | Admit: 2014-07-05 | Discharge: 2014-07-05 | Disposition: A | Discharge status: Home / Self Care | Payer: Medicare Other | Source: Ambulatory Visit | Attending: Internal Medicine | Admitting: Internal Medicine

## 2014-07-05 DIAGNOSIS — N6001 Solitary cyst of right breast: Secondary | ICD-10-CM

## 2015-01-02 ENCOUNTER — Other Ambulatory Visit: Payer: Self-pay | Admitting: Internal Medicine

## 2015-01-02 DIAGNOSIS — R1084 Generalized abdominal pain: Secondary | ICD-10-CM

## 2015-01-04 ENCOUNTER — Ambulatory Visit
Admission: RE | Admit: 2015-01-04 | Discharge: 2015-01-04 | Disposition: A | Discharge status: Home / Self Care | Payer: Medicare Other | Source: Ambulatory Visit | Attending: Internal Medicine | Admitting: Internal Medicine

## 2015-01-04 DIAGNOSIS — R1084 Generalized abdominal pain: Secondary | ICD-10-CM

## 2015-01-04 MED ORDER — IOPAMIDOL (ISOVUE-300) INJECTION 61%
125.0000 mL | Freq: Once | INTRAVENOUS | Status: AC | PRN
  Administered 2015-01-04: 125 mL via INTRAVENOUS

## 2016-01-10 ENCOUNTER — Other Ambulatory Visit (HOSPITAL_COMMUNITY): Payer: Self-pay | Admitting: *Deleted

## 2016-01-13 ENCOUNTER — Ambulatory Visit (HOSPITAL_COMMUNITY)
Admission: RE | Admit: 2016-01-13 | Discharge: 2016-01-13 | Disposition: A | Discharge status: Home / Self Care | Payer: Medicare Other | Source: Ambulatory Visit | Attending: Internal Medicine | Admitting: Internal Medicine

## 2016-01-13 DIAGNOSIS — D509 Iron deficiency anemia, unspecified: Secondary | ICD-10-CM | POA: Insufficient documentation

## 2016-01-13 LAB — PREPARE RBC (CROSSMATCH)

## 2016-01-13 MED ORDER — ACETAMINOPHEN 325 MG PO TABS
650.0000 mg | ORAL_TABLET | Freq: Once | ORAL | Status: AC
  Administered 2016-01-13: 650 mg via ORAL

## 2016-01-13 MED ORDER — FUROSEMIDE 10 MG/ML IJ SOLN
INTRAMUSCULAR | Status: AC
  Administered 2016-01-13: 40 mg via INTRAVENOUS
  Filled 2016-01-13: qty 4

## 2016-01-13 MED ORDER — ACETAMINOPHEN 325 MG PO TABS
ORAL_TABLET | ORAL | Status: AC
  Administered 2016-01-13: 650 mg via ORAL
  Filled 2016-01-13: qty 2

## 2016-01-13 MED ORDER — FUROSEMIDE 10 MG/ML IJ SOLN
40.0000 mg | Freq: Once | INTRAMUSCULAR | Status: AC
  Administered 2016-01-13: 40 mg via INTRAVENOUS

## 2016-01-13 MED ORDER — SODIUM CHLORIDE 0.9 % IV SOLN
Freq: Once | INTRAVENOUS | Status: AC
  Administered 2016-01-13: 10:00:00 via INTRAVENOUS

## 2016-01-13 NOTE — Progress Notes (Signed)
Patient c/o nausea at 15 minute vital sign check. Pump paused momentarily and Dr. Valentina LucksGriffin notified. Ok to continue blood per Dr. Valentina LucksGriffin unless patient has major symptoms of transfusion reaction. Pump restarted. Patient requested and was given soda crackers. She states that she has problems with nausea from time to time.

## 2016-01-14 LAB — TYPE AND SCREEN
ABO/RH(D): B NEG
ANTIBODY SCREEN: NEGATIVE
UNIT DIVISION: 0
Unit division: 0

## 2016-02-05 ENCOUNTER — Other Ambulatory Visit (HOSPITAL_COMMUNITY): Payer: Self-pay | Admitting: *Deleted

## 2016-02-06 ENCOUNTER — Ambulatory Visit (HOSPITAL_COMMUNITY)
Admission: RE | Admit: 2016-02-06 | Discharge: 2016-02-06 | Disposition: A | Discharge status: Home / Self Care | Payer: Medicare Other | Source: Ambulatory Visit | Attending: Internal Medicine | Admitting: Internal Medicine

## 2016-02-06 DIAGNOSIS — D509 Iron deficiency anemia, unspecified: Secondary | ICD-10-CM | POA: Diagnosis present

## 2016-02-06 MED ORDER — SODIUM CHLORIDE 0.9 % IV SOLN
500.0000 mg | Freq: Once | INTRAVENOUS | Status: AC
  Administered 2016-02-06: 500 mg via INTRAVENOUS
  Filled 2016-02-06: qty 10

## 2016-02-06 MED ORDER — SODIUM CHLORIDE 0.9 % IV SOLN
25.0000 mg | Freq: Once | INTRAVENOUS | Status: AC
  Administered 2016-02-06: 25 mg via INTRAVENOUS
  Filled 2016-02-06: qty 0.5

## 2016-02-06 NOTE — Discharge Instructions (Signed)

## 2016-02-06 NOTE — Progress Notes (Signed)
Patient states she feels better, just tired and wants to go home. VSS. Face no longer flushed.

## 2016-02-06 NOTE — Progress Notes (Signed)
Patient states that she is feeling flushed again and it woke her up. Face is red. Infed stopped. VS obtained. Dr. Valentina LucksGriffin notified. Will have patient stay for 30 min for observation.

## 2016-02-06 NOTE — Progress Notes (Signed)
Patient c/o feeling flushed and dizzy, sitting up in bed fanning herself, face red, sweating. Stopped Infed and obtained vs. Patient states she is feeling better except still feels hot. Called Dr. Valentina LucksGriffin. Said to restart at lower rate and if patient has any further problems, stop and discontinue. Restarted at 40 ml/hr at 1255

## 2016-03-04 ENCOUNTER — Other Ambulatory Visit: Payer: Self-pay | Admitting: Internal Medicine

## 2016-03-04 DIAGNOSIS — N183 Chronic kidney disease, stage 3 unspecified: Secondary | ICD-10-CM

## 2016-03-06 ENCOUNTER — Ambulatory Visit
Admission: RE | Admit: 2016-03-06 | Discharge: 2016-03-06 | Disposition: A | Discharge status: Home / Self Care | Payer: Medicare Other | Source: Ambulatory Visit | Attending: Internal Medicine | Admitting: Internal Medicine

## 2016-03-06 DIAGNOSIS — N183 Chronic kidney disease, stage 3 unspecified: Secondary | ICD-10-CM

## 2016-03-20 ENCOUNTER — Ambulatory Visit: Payer: Medicare Other | Admitting: *Deleted

## 2016-04-09 ENCOUNTER — Encounter: Payer: Medicare Other | Attending: Internal Medicine | Admitting: *Deleted

## 2016-04-09 DIAGNOSIS — E119 Type 2 diabetes mellitus without complications: Secondary | ICD-10-CM | POA: Diagnosis not present

## 2016-04-09 DIAGNOSIS — Z713 Dietary counseling and surveillance: Secondary | ICD-10-CM | POA: Diagnosis present

## 2016-04-09 DIAGNOSIS — E118 Type 2 diabetes mellitus with unspecified complications: Secondary | ICD-10-CM

## 2016-04-09 NOTE — Patient Instructions (Signed)
Plan:  Aim for 3 Carb Choices per meal (45 grams) +/- 1 either way  Aim for 0-2 Carbs per snack if hungry  Include protein in moderation with your meals and snacks Consider reading food labels for Total Carbohydrate of foods We will discuss your activity level at our next visit Continue checking BG at alternate times per day  Continue taking medication as directed by MD

## 2016-04-15 NOTE — Progress Notes (Signed)
Diabetes Self-Management Education  Visit Type: First/Initial  Appt. Start Time: 1000 Appt. End Time: 1130  04/15/2016  Victoria Kaiser, identified by name and date of birth, is a 60 y.o. female with a diagnosis of Diabetes: Type 2. She is here with her husband who participated in the visit when asked. She has multiple medical problems with questions about relationship of diabetes to some of these. Also has questions about her anemia and if that affects her A1c results. Her activity level is limited by back pain and leg swelling if she stands too long. She has Rx for compression hose which she plans to get today.  ASSESSMENT  Height 5' (1.524 m), weight 202 lb 14.4 oz (92 kg). Body mass index is 39.63 kg/m.      Diabetes Self-Management Education - 04/09/16 1014      Visit Information   Visit Type First/Initial     Initial Visit   Diabetes Type Type 2   Are you currently following a meal plan? No   Are you taking your medications as prescribed? Yes   Date Diagnosed 2013     Health Coping   How would you rate your overall health? Poor     Psychosocial Assessment   Patient Belief/Attitude about Diabetes Other (comment)  confused   Other persons present Patient   Patient Concerns Nutrition/Meal planning   Preferred Learning Style Auditory;Visual   What is the last grade level you completed in school? some college     Pre-Education Assessment   Patient understands the diabetes disease and treatment process. Needs Instruction   Patient understands incorporating nutritional management into lifestyle. Needs Instruction   Patient undertands incorporating physical activity into lifestyle. Needs Instruction   Patient understands using medications safely. Needs Instruction   Patient understands monitoring blood glucose, interpreting and using results Needs Instruction   Patient understands prevention, detection, and treatment of acute complications. Needs Instruction   Patient  understands prevention, detection, and treatment of chronic complications. Needs Instruction   Patient understands how to develop strategies to address psychosocial issues. Needs Instruction   Patient understands how to develop strategies to promote health/change behavior. Needs Instruction     Complications   Last HgB A1C per patient/outside source 8.7 %   How often do you check your blood sugar? 3-4 times/day   Fasting Blood glucose range (mg/dL) >308;657-846>200;180-200   Number of hypoglycemic episodes per month 0   Have you had a dilated eye exam in the past 12 months? Yes   Have you had a dental exam in the past 12 months? No   Are you checking your feet? Yes   How many days per week are you checking your feet? 7     Dietary Intake   Breakfast sausage, eggs OR whole large bagel with cream cheese OR 1-2 English Muffin with deviled egg   Snack (morning) yes... fresh fruit, raw veg, yogurt,    Lunch left overs OR canned meal OR hot pockets OR pimento cheese sandwich   Snack (afternoon) lance crackers   Dinner stoufers meal OR pizza OR meat and 2 vegetables usually   Snack (evening) same as during the day (stress eater)   Beverage(s) water, sweet tea occasionally, lemonade slushy for IBS     Exercise   Exercise Type ADL's   How many days per week to you exercise? 0   How many minutes per day do you exercise? 0   Total minutes per week of exercise 0  Patient Education   Previous Diabetes Education Yes (please comment)  2014   Disease state  Definition of diabetes, type 1 and 2, and the diagnosis of diabetes   Nutrition management  Role of diet in the treatment of diabetes and the relationship between the three main macronutrients and blood glucose level;Food label reading, portion sizes and measuring food.;Carbohydrate counting   Physical activity and exercise  Role of exercise on diabetes management, blood pressure control and cardiac health.   Medications Reviewed patients medication for  diabetes, action, purpose, timing of dose and side effects.   Monitoring Purpose and frequency of SMBG.;Identified appropriate SMBG and/or A1C goals.   Chronic complications Relationship between chronic complications and blood glucose control   Psychosocial adjustment Role of stress on diabetes     Individualized Goals (developed by patient)   Nutrition Follow meal plan discussed   Physical Activity Exercise 3-5 times per week   Medications take my medication as prescribed   Monitoring  test blood glucose pre and post meals as discussed     Post-Education Assessment   Patient understands the diabetes disease and treatment process. Demonstrates understanding / competency   Patient understands incorporating nutritional management into lifestyle. Demonstrates understanding / competency   Patient undertands incorporating physical activity into lifestyle. Needs Review   Patient understands using medications safely. Demonstrates understanding / competency   Patient understands monitoring blood glucose, interpreting and using results Demonstrates understanding / competency   Patient understands prevention, detection, and treatment of acute complications. Demonstrates understanding / competency   Patient understands prevention, detection, and treatment of chronic complications. Demonstrates understanding / competency   Patient understands how to develop strategies to address psychosocial issues. Demonstrates understanding / competency   Patient understands how to develop strategies to promote health/change behavior. Demonstrates understanding / competency     Outcomes   Expected Outcomes Demonstrated interest in learning. Expect positive outcomes   Future DMSE PRN   Program Status Completed      Individualized Plan for Diabetes Self-Management Training:   Learning Objective:  Patient will have a greater understanding of diabetes self-management. Patient education plan is to attend individual  and/or group sessions per assessed needs and concerns.   Plan:   Patient Instructions  Plan:  Aim for 3 Carb Choices per meal (45 grams) +/- 1 either way  Aim for 0-2 Carbs per snack if hungry  Include protein in moderation with your meals and snacks Consider reading food labels for Total Carbohydrate of foods We will discuss your activity level at our next visit, consider Arm Chair Exercises in the meantime  Continue checking BG at alternate times per day  Continue taking medication as directed by MD      Expected Outcomes:  Demonstrated interest in learning. Expect positive outcomes  Education material provided: Living Well with Diabetes, A1C conversion sheet, Meal plan card and Carbohydrate counting sheet, BG Log Book  If problems or questions, patient to contact team via:  Phone and Email  Future DSME appointment: PRN

## 2016-04-29 ENCOUNTER — Other Ambulatory Visit: Payer: Self-pay | Admitting: Internal Medicine

## 2016-04-29 DIAGNOSIS — Z1231 Encounter for screening mammogram for malignant neoplasm of breast: Secondary | ICD-10-CM

## 2016-04-30 ENCOUNTER — Encounter: Payer: Self-pay | Admitting: Internal Medicine

## 2016-05-05 ENCOUNTER — Ambulatory Visit
Admission: RE | Admit: 2016-05-05 | Discharge: 2016-05-05 | Disposition: A | Discharge status: Home / Self Care | Payer: Medicare Other | Source: Ambulatory Visit | Attending: Internal Medicine | Admitting: Internal Medicine

## 2016-05-05 DIAGNOSIS — Z1231 Encounter for screening mammogram for malignant neoplasm of breast: Secondary | ICD-10-CM
